# Patient Record
Sex: Male | Born: 1975 | Race: White | Hispanic: No | Marital: Married | Smoking: Current every day smoker
Health system: Southern US, Community
[De-identification: ages and names within clinical notes are randomized; demographics above are authoritative.]

## PROBLEM LIST (undated history)

## (undated) DIAGNOSIS — I639 Cerebral infarction, unspecified: Secondary | ICD-10-CM

## (undated) HISTORY — PX: TONSILLECTOMY: SUR1361

---

## 2016-03-15 ENCOUNTER — Emergency Department (HOSPITAL_COMMUNITY): Payer: No Typology Code available for payment source

## 2016-03-15 ENCOUNTER — Encounter (HOSPITAL_COMMUNITY): Payer: Self-pay | Admitting: Emergency Medicine

## 2016-03-15 DIAGNOSIS — M545 Low back pain: Secondary | ICD-10-CM | POA: Insufficient documentation

## 2016-03-15 DIAGNOSIS — F17200 Nicotine dependence, unspecified, uncomplicated: Secondary | ICD-10-CM | POA: Diagnosis not present

## 2016-03-15 DIAGNOSIS — Y939 Activity, unspecified: Secondary | ICD-10-CM | POA: Diagnosis not present

## 2016-03-15 DIAGNOSIS — S0001XA Abrasion of scalp, initial encounter: Secondary | ICD-10-CM | POA: Insufficient documentation

## 2016-03-15 DIAGNOSIS — Y999 Unspecified external cause status: Secondary | ICD-10-CM | POA: Insufficient documentation

## 2016-03-15 DIAGNOSIS — R55 Syncope and collapse: Secondary | ICD-10-CM | POA: Diagnosis not present

## 2016-03-15 DIAGNOSIS — Y92410 Unspecified street and highway as the place of occurrence of the external cause: Secondary | ICD-10-CM | POA: Diagnosis not present
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
## 2016-03-16 ENCOUNTER — Emergency Department (HOSPITAL_COMMUNITY)
Admission: EM | Admit: 2016-03-16 | Discharge: 2016-03-16 | Disposition: A | Discharge status: Home / Self Care | Payer: No Typology Code available for payment source | Attending: Emergency Medicine | Admitting: Emergency Medicine

## 2016-03-16 ENCOUNTER — Emergency Department (HOSPITAL_COMMUNITY): Payer: No Typology Code available for payment source

## 2016-03-16 DIAGNOSIS — R4020 Unspecified coma: Secondary | ICD-10-CM

## 2016-03-16 DIAGNOSIS — M7918 Myalgia, other site: Secondary | ICD-10-CM

## 2016-03-16 MED ORDER — NAPROXEN 500 MG PO TABS: 500.0000 mg | ORAL_TABLET | Freq: Two times a day (BID) | ORAL | 0 refills | Status: DC

## 2016-03-16 MED ORDER — OXYCODONE-ACETAMINOPHEN 5-325 MG PO TABS
1.0000 | ORAL_TABLET | Freq: Once | ORAL | Status: AC
Start: 2016-03-16 — End: 2016-03-16
  Administered 2016-03-16: 1 via ORAL
  Filled 2016-03-16: qty 1

## 2016-03-16 MED ORDER — CYCLOBENZAPRINE HCL 10 MG PO TABS: 10.0000 mg | ORAL_TABLET | Freq: Two times a day (BID) | ORAL | 0 refills | Status: DC | PRN
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
## 2016-03-16 NOTE — ED Notes (Signed)
Pt ambulated in hallway with no problems. 

## 2018-02-20 ENCOUNTER — Emergency Department (HOSPITAL_COMMUNITY)
Admission: EM | Admit: 2018-02-20 | Discharge: 2018-02-20 | Disposition: A | Discharge status: Home / Self Care | Payer: Self-pay | Attending: Emergency Medicine | Admitting: Emergency Medicine

## 2018-02-20 ENCOUNTER — Encounter (HOSPITAL_COMMUNITY): Payer: Self-pay

## 2018-02-20 ENCOUNTER — Emergency Department (HOSPITAL_COMMUNITY): Payer: Self-pay

## 2018-02-20 DIAGNOSIS — L0291 Cutaneous abscess, unspecified: Secondary | ICD-10-CM

## 2018-02-20 DIAGNOSIS — L02215 Cutaneous abscess of perineum: Secondary | ICD-10-CM | POA: Insufficient documentation

## 2018-02-20 DIAGNOSIS — R61 Generalized hyperhidrosis: Secondary | ICD-10-CM | POA: Insufficient documentation

## 2018-02-20 DIAGNOSIS — R509 Fever, unspecified: Secondary | ICD-10-CM | POA: Insufficient documentation

## 2018-02-20 DIAGNOSIS — F17210 Nicotine dependence, cigarettes, uncomplicated: Secondary | ICD-10-CM | POA: Insufficient documentation

## 2018-02-20 DIAGNOSIS — L03818 Cellulitis of other sites: Secondary | ICD-10-CM | POA: Insufficient documentation

## 2018-02-20 LAB — URINALYSIS, ROUTINE W REFLEX MICROSCOPIC
BACTERIA UA: NONE SEEN
BILIRUBIN URINE: NEGATIVE
Glucose, UA: NEGATIVE mg/dL
Hgb urine dipstick: NEGATIVE
Ketones, ur: 5 mg/dL — AB
LEUKOCYTES UA: NEGATIVE
Nitrite: NEGATIVE
Protein, ur: 30 mg/dL — AB
SPECIFIC GRAVITY, URINE: 1.03 (ref 1.005–1.030)
pH: 5 (ref 5.0–8.0)

## 2018-02-20 LAB — CBC WITH DIFFERENTIAL/PLATELET
Abs Immature Granulocytes: 0.1 10*3/uL (ref 0.0–0.1)
Basophils Absolute: 0.2 10*3/uL — ABNORMAL HIGH (ref 0.0–0.1)
Basophils Relative: 1 %
EOS ABS: 0.2 10*3/uL (ref 0.0–0.7)
Eosinophils Relative: 1 %
HEMATOCRIT: 50.7 % (ref 39.0–52.0)
Hemoglobin: 17.2 g/dL — ABNORMAL HIGH (ref 13.0–17.0)
Immature Granulocytes: 1 %
LYMPHS ABS: 2.1 10*3/uL (ref 0.7–4.0)
Lymphocytes Relative: 11 %
MCH: 29.8 pg (ref 26.0–34.0)
MCHC: 33.9 g/dL (ref 30.0–36.0)
MCV: 87.9 fL (ref 78.0–100.0)
MONO ABS: 2 10*3/uL — AB (ref 0.1–1.0)
MONOS PCT: 10 %
Neutro Abs: 15.5 10*3/uL — ABNORMAL HIGH (ref 1.7–7.7)
Neutrophils Relative %: 76 %
Platelets: 396 10*3/uL (ref 150–400)
RBC: 5.77 MIL/uL (ref 4.22–5.81)
RDW: 12.4 % (ref 11.5–15.5)
WBC: 20 10*3/uL — ABNORMAL HIGH (ref 4.0–10.5)

## 2018-02-20 LAB — BASIC METABOLIC PANEL
Anion gap: 9 (ref 5–15)
BUN: 12 mg/dL (ref 6–20)
CALCIUM: 9.3 mg/dL (ref 8.9–10.3)
CHLORIDE: 104 mmol/L (ref 98–111)
CO2: 25 mmol/L (ref 22–32)
CREATININE: 1.08 mg/dL (ref 0.61–1.24)
GFR calc Af Amer: >60 mL/min (ref >60)
GFR calc non Af Amer: >60 mL/min (ref >60)
GLUCOSE: 91 mg/dL (ref 70–99)
Potassium: 4 mmol/L (ref 3.5–5.1)
Sodium: 138 mmol/L (ref 135–145)

## 2018-02-20 MED ORDER — HYDROMORPHONE HCL 1 MG/ML IJ SOLN
1.0000 mg | Freq: Once | INTRAMUSCULAR | Status: AC
  Administered 2018-02-20: 1 mg via INTRAVENOUS
  Filled 2018-02-20: qty 1

## 2018-02-20 MED ORDER — CEPHALEXIN 500 MG PO CAPS: 500.0000 mg | ORAL_CAPSULE | Freq: Three times a day (TID) | ORAL | 0 refills | Status: AC

## 2018-02-20 MED ORDER — SODIUM CHLORIDE 0.9 % IV BOLUS
1000.0000 mL | Freq: Once | INTRAVENOUS | Status: AC
  Administered 2018-02-20: 1000 mL via INTRAVENOUS

## 2018-02-20 MED ORDER — KETOROLAC TROMETHAMINE 15 MG/ML IJ SOLN
15.0000 mg | Freq: Once | INTRAMUSCULAR | Status: AC
  Administered 2018-02-20: 15 mg via INTRAVENOUS
  Filled 2018-02-20: qty 1

## 2018-02-20 MED ORDER — SULFAMETHOXAZOLE-TRIMETHOPRIM 800-160 MG PO TABS: 1.0000 | ORAL_TABLET | Freq: Two times a day (BID) | ORAL | 0 refills | Status: AC

## 2018-02-20 MED ORDER — HYDROCODONE-ACETAMINOPHEN 5-325 MG PO TABS: 1.0000 | ORAL_TABLET | ORAL | 0 refills | Status: DC | PRN

## 2018-02-20 MED ORDER — LIDOCAINE-EPINEPHRINE (PF) 2 %-1:200000 IJ SOLN
30.0000 mL | Freq: Once | INTRAMUSCULAR | Status: AC
  Administered 2018-02-20: 10 mL
  Filled 2018-02-20: qty 40

## 2018-02-20 MED ORDER — IOHEXOL 300 MG/ML  SOLN
100.0000 mL | Freq: Once | INTRAMUSCULAR | Status: AC | PRN
  Administered 2018-02-20: 100 mL via INTRAVENOUS

## 2018-02-20 MED ORDER — VANCOMYCIN HCL 10 G IV SOLR
2000.0000 mg | Freq: Once | INTRAVENOUS | Status: AC
  Administered 2018-02-20: 2000 mg via INTRAVENOUS
  Filled 2018-02-20: qty 2000

## 2018-02-20 MED ORDER — FENTANYL CITRATE (PF) 100 MCG/2ML IJ SOLN
100.0000 ug | Freq: Once | INTRAMUSCULAR | Status: AC
  Administered 2018-02-20: 100 ug via INTRAVENOUS
  Filled 2018-02-20: qty 2

## 2018-02-20 NOTE — ED Notes (Signed)
Spoke with [place] in main lab about CMP and CBC as it was collected but is not showing in process. Per [place] she is receiving it now.

## 2018-02-20 NOTE — ED Triage Notes (Signed)
Pt presents with swelling to testicles x 3 days.  Pt reports h/o abscess to area, placed needle to area to attempt to drain area with no drainage reported.  Pt reports difficulty voiding and with bowel movements.  Pt reports putting boil-ease to area without relief.

## 2018-02-20 NOTE — Discharge Instructions (Addendum)
You were evaluated in the Emergency Department and after careful evaluation, we did not find any emergent condition requiring admission or further testing in the hospital.  Your symptoms today seem to be due to an abscess with surrounding cellulitis.  The abscess was drained and some packing material was placed.  This material needs to be removed and 2 days.  Please return to your primary care doctor or the emergency department in 2 days for a wound check and removal of this material.  Please take the antibiotics as directed.  Use the pain medicine as needed for pain.  Please return to the Emergency Department if you experience any worsening of your condition.  We encourage you to follow up with a primary care provider.  Thank you for allowing us to be a part of your care.

## 2018-02-20 NOTE — ED Notes (Signed)
Patient transported to CT 

## 2018-02-20 NOTE — ED Provider Notes (Signed)
[facility] Emergency Department Provider Note MRN:  [id number]  Arrival date & time: 02/20/18     Chief Complaint   Abscess   History of Present Illness   Alexander Coleman is a 42 y.o. year-old male with no pertinent past medical history presenting to the ED with chief complaint of abscess.  Patient explains he has been experiencing a "boil" in his perineum for the past 1 to 2 days.  Gradual onset, pain is progressively worsening.  Now described as a severe tenderness.  Patient tried to bring the oil to ahead today with a needle but was unsuccessful.  Patient endorsing subjective fevers with chills today.  Some excessive diaphoresis today.  Patient denies chest pain or shortness of breath, no abdominal pain.  Some dysuria today as well.  No exacerbating or relieving factors.  Review of Systems  A complete 10 system review of systems was obtained and all systems are negative except as noted in the HPI and PMH.   Patient's Health History   History reviewed. No pertinent past medical history.  Past Surgical History:  Procedure Laterality Date  . TONSILLECTOMY      History reviewed. No pertinent family history.  Social History   Socioeconomic History  . Marital status: Married    Spouse name: Not on file  . Number of children: Not on file  . Years of education: Not on file  . Highest education level: Not on file  Occupational History  . Not on file  Social Needs  . Financial resource strain: Not on file  . Food insecurity:    Worry: Not on file    Inability: Not on file  . Transportation needs:    Medical: Not on file    Non-medical: Not on file  Tobacco Use  . Smoking status: Current Every Day Smoker    Packs/day: 2.00  . Smokeless tobacco: Never Used  Substance and Sexual Activity  . Alcohol use: Yes  . Drug use: Not on file  . Sexual activity: Not on file  Lifestyle  . Physical activity:    Days per week: Not on file    Minutes per session: Not  on file  . Stress: Not on file  Relationships  . Social connections:    Talks on phone: Not on file    Gets together: Not on file    Attends religious service: Not on file    Active member of club or organization: Not on file    Attends meetings of clubs or organizations: Not on file    Relationship status: Not on file  . Intimate partner violence:    Fear of current or ex partner: Not on file    Emotionally abused: Not on file    Physically abused: Not on file    Forced sexual activity: Not on file  Other Topics Concern  . Not on file  Social History Narrative  . Not on file     Physical Exam  Vital Signs and Nursing Notes reviewed Vitals:   02/20/18 2115 02/20/18 2130  BP: 117/77 117/70  Pulse: 89 88  Resp: 18 16  Temp:    SpO2: 97% 94%    CONSTITUTIONAL: Well-appearing, mildly diaphoretic and in moderate distress due to pain NEURO:  Alert and oriented x 3, no focal deficits EYES:  eyes equal and reactive ENT/NECK:  no LAD, no JVD CARDIO: Regular rate, well-perfused, normal S1 and S2 PULM:  CTAB no wheezing or rhonchi GI/GU:  normal  bowel sounds, non-distended, non-tender; exquisite tenderness to palpation to a 3 to 4 cm fluctuant nodule to the left perineum, erythematous MSK/SPINE:  No gross deformities, no edema SKIN:  no rash, atraumatic PSYCH:  Appropriate speech and behavior  Diagnostic and Interventional Summary    EKG Interpretation  Date/Time:    Ventricular Rate:    PR Interval:    QRS Duration:   QT Interval:    QTC Calculation:   R Axis:     Text Interpretation:        Labs Reviewed  CBC WITH DIFFERENTIAL/PLATELET - Abnormal; Notable for the following components:      Result Value   WBC 20.0 (*)    Hemoglobin 17.2 (*)    Neutro Abs 15.5 (*)    Monocytes Absolute 2.0 (*)    Basophils Absolute 0.2 (*)    All other components within normal limits  URINALYSIS, ROUTINE W REFLEX MICROSCOPIC - Abnormal; Notable for the following components:    Color, Urine AMBER (*)    APPearance HAZY (*)    Ketones, ur 5 (*)    Protein, ur 30 (*)    All other components within normal limits  BASIC METABOLIC PANEL    CT ABDOMEN PELVIS W CONTRAST  Final Result      Medications  vancomycin (VANCOCIN) 2,000 mg in sodium chloride 0.9 % 500 mL IVPB (2,000 mg Intravenous New Bag/Given 02/20/18 2111)  ketorolac (TORADOL) 15 MG/ML injection 15 mg (15 mg Intravenous Given 02/20/18 1827)  HYDROmorphone (DILAUDID) injection 1 mg (1 mg Intravenous Given 02/20/18 1827)  sodium chloride 0.9 % bolus 1,000 mL (0 mLs Intravenous Stopped 02/20/18 2042)  iohexol (OMNIPAQUE) 300 MG/ML solution 100 mL (100 mLs Intravenous Contrast Given 02/20/18 1916)  lidocaine-EPINEPHrine (XYLOCAINE W/EPI) 2 %-1:200000 (PF) injection 30 mL (10 mLs Other Given 02/20/18 2043)  fentaNYL (SUBLIMAZE) injection 100 mcg (100 mcg Intravenous Given 02/20/18 2047)  fentaNYL (SUBLIMAZE) injection 100 mcg (100 mcg Intravenous Given 02/20/18 2042)     .Marland Kitchen.Incision and Drainage Date/Time: 02/20/2018 9:11 PM Performed by: Sabas SousBero, Maddalyn Lutze M, MD Authorized by: Sabas SousBero, Hailley Byers M, MD   Consent:    Consent obtained:  Verbal   Consent given by:  Patient   Risks discussed:  Incomplete drainage Location:    Type:  Abscess   Location:  Anogenital   Anogenital location:  Perineum Pre-procedure details:    Skin preparation:  Betadine Sedation:    Sedation type:  Anxiolysis (fentanyl 200mcg total) Anesthesia (see MAR for exact dosages):    Anesthesia method:  Local infiltration   Local anesthetic:  Lidocaine 1% WITH epi Procedure type:    Complexity:  Complex Procedure details:    Needle aspiration: no     Incision types:  Single straight   Incision depth:  Subcutaneous   Scalpel blade:  11   Wound management:  Probed and deloculated and irrigated with saline   Drainage:  Purulent   Drainage amount:  Copious   Wound treatment:  Wound left open   Packing materials:  1/2 in iodoform gauze Post-procedure  details:    Patient tolerance of procedure:  Tolerated well, no immediate complications    Critical Care  ED Course and Medical Decision Making  I have reviewed the triage vital signs and the nursing notes.  Pertinent labs & imaging results that were available during my care of the patient were reviewed by me and considered in my medical decision making (see below for details). Clinical Course as of Feb 21 2203  Tue Feb 20, 2018  [age] year old male presenting with abscess of the perineum, likely surrounding cellulitis, subjective fevers.  Vital signs stable, but diaphoretic appearing.  Will CT to exclude the possibility of deep space abscess.  White count 20 will empirically cover with vancomycin.   [MB]    Clinical Course User Index [MB] Sabas Sous, MD    CT with no evidence of deeper space abscess.  Moderate sized abscess of the perineum incised and drained as described above.  Given IV vancomycin, given prescription for Keflex and Bactrim given the concern for surrounding cellulitis.  Wound was packed, patient instructed to return to the ED or see his primary care provider for wound check and removal of packing material in 2 days.  After the discussed management above, the patient was determined to be safe for discharge.  The patient was in agreement with this plan and all questions regarding their care were answered.  ED return precautions were discussed and the patient will return to the ED with any significant worsening of condition.  Elmer Sow. Pilar Plate, MD [facility] Emergency Medicine [facility] [email or web address]  Final Clinical Impressions(s) / ED Diagnoses     ICD-10-CM   1. Abscess L02.91   2. Cellulitis of other specified site L03.818     ED Discharge Orders        Ordered    HYDROcodone-acetaminophen (NORCO/VICODIN) 5-325 MG tablet  Every 4 hours PRN     02/20/18 2159    sulfamethoxazole-trimethoprim (BACTRIM DS,SEPTRA DS) 800-160 MG tablet   2 times daily     02/20/18 2159    cephALEXin (KEFLEX) 500 MG capsule  3 times daily     02/20/18 2159         Sabas Sous, MD 02/20/18 2204

## 2018-02-20 NOTE — ED Notes (Signed)
Patient verbalizes understanding of discharge instructions. Opportunity for questioning and answers were provided. Armband removed by staff, pt discharged from ED.  

## 2018-02-20 NOTE — ED Provider Notes (Signed)
Patient placed in Quick Look pathway, seen and evaluated   Chief Complaint: Perineal abscess  HPI:   Patient presents with 3-day history of progressively worsening swelling and tenderness to the perineum.  Denies testicular pain but notes mild scrotal swelling this morning.  He does note nausea and intermittent abdominal pain but denies any this time.  Denies vomiting, fevers, or chills.  Attempted to lance the area with a needle with no drainage.  States that he has had some difficulty voiding but denies urinary frequency, urgency, or dysuria.  Denies hematuria. Has not had a bowel movement in 3 days.  ROS: Positive for abscess, scrotal swelling, nausea, difficulty urinating Negative for vomiting, melena, hematuria, dysuria  Physical Exam:   Gen: No distress  Neuro: Awake and Alert  Skin: Warm    Focused Exam: Examination performed in the presence of a chaperone.  Abdomen soft nontender.  No scrotal swelling or testicular tenderness to palpation.  There is a 2 x 3 cm area of fluctuance and tenderness along the perineum. No drainage.    Initiation of care has begun. The patient has been counseled on the process, plan, and necessity for staying for the completion/evaluation, and the remainder of the medical screening examination    Jeanie SewerFawze, Kerby Borner A, PA-C 02/20/18 1543    Little, Ambrose Finlandachel Morgan, MD 02/21/18 [phone]

## 2018-02-22 ENCOUNTER — Other Ambulatory Visit: Payer: Self-pay

## 2018-02-22 ENCOUNTER — Emergency Department (HOSPITAL_COMMUNITY)
Admission: EM | Admit: 2018-02-22 | Discharge: 2018-02-22 | Disposition: A | Discharge status: Home / Self Care | Payer: Self-pay | Attending: Emergency Medicine | Admitting: Emergency Medicine

## 2018-02-22 ENCOUNTER — Encounter (HOSPITAL_COMMUNITY): Payer: Self-pay | Admitting: Emergency Medicine

## 2018-02-22 DIAGNOSIS — Z4801 Encounter for change or removal of surgical wound dressing: Secondary | ICD-10-CM | POA: Insufficient documentation

## 2018-02-22 DIAGNOSIS — F17210 Nicotine dependence, cigarettes, uncomplicated: Secondary | ICD-10-CM | POA: Insufficient documentation

## 2018-02-22 DIAGNOSIS — Z5189 Encounter for other specified aftercare: Secondary | ICD-10-CM

## 2018-02-22 DIAGNOSIS — Z79899 Other long term (current) drug therapy: Secondary | ICD-10-CM | POA: Insufficient documentation

## 2018-02-22 NOTE — ED Provider Notes (Signed)
[facility] EMERGENCY DEPARTMENT Provider Note  CSN: [id number] Arrival date & time: 02/22/18  1149   History   Chief Complaint Chief Complaint  Patient presents with  . packing removal    HPI Alexander Coleman is a 42 y.o. male with no significant medical history who presented to the ED for wound check. Patient had I&D of perineum done on 02/20/18 that was packed. Denies fever, chills, warmth, unusual odor or discharge. He has had his wife help with dressing changes at home. Denies changes in urinary habits.  History reviewed. No pertinent past medical history.  There are no active problems to display for this patient.   Past Surgical History:  Procedure Laterality Date  . TONSILLECTOMY      Home Medications    Prior to Admission medications   Medication Sig Start Date End Date Taking? Authorizing Provider  cephALEXin (KEFLEX) 500 MG capsule Take 1 capsule (500 mg total) by mouth 3 (three) times daily for 7 days. 02/20/18 02/27/18  Sabas Sous, MD  cyclobenzaprine (FLEXERIL) 10 MG tablet Take 1 tablet (10 mg total) by mouth 2 (two) times daily as needed for muscle spasms. Patient not taking: Reported on 02/20/2018 03/16/16   Horton, Mayer Masker, MD  HYDROcodone-acetaminophen (NORCO/VICODIN) 5-325 MG tablet Take 1 tablet by mouth every 4 (four) hours as needed. 02/20/18   Sabas Sous, MD  ibuprofen (ADVIL,MOTRIN) 800 MG tablet Take 800 mg by mouth every 8 (eight) hours as needed for mild pain.    [provider]  loratadine (CLARITIN) 10 MG tablet Take 10 mg by mouth daily.    [provider]  naproxen (NAPROSYN) 500 MG tablet Take 1 tablet (500 mg total) by mouth 2 (two) times daily. Patient not taking: Reported on 02/20/2018 03/16/16   Horton, Mayer Masker, MD  sulfamethoxazole-trimethoprim (BACTRIM DS,SEPTRA DS) 800-160 MG tablet Take 1 tablet by mouth 2 (two) times daily for 7 days. 02/20/18 02/27/18  Sabas Sous, MD    Family History History  reviewed. No pertinent family history.  Social History Social History   Tobacco Use  . Smoking status: Current Every Day Smoker    Packs/day: 2.00  . Smokeless tobacco: Never Used  Substance Use Topics  . Alcohol use: Yes  . Drug use: Not on file     Allergies   Patient has no known allergies.   Review of Systems Review of Systems  Constitutional: Negative for chills and fever.  Gastrointestinal: Negative.   Genitourinary: Negative.   Skin: Negative.   Hematological: Negative.      Physical Exam Updated Vital Signs BP 135/81 (BP Location: Right Arm)   Pulse 92   Temp 98.9 F (37.2 C) (Oral)   Resp 18   SpO2 96%   Physical Exam  Constitutional: Vital signs are normal. He appears well-developed and well-nourished. He is cooperative.  Genitourinary: Testes normal and penis normal.    Uncircumcised.  Neurological: He is alert.  Skin: Skin is warm. No erythema.  Vitals reviewed.    ED Treatments / Results  Labs (all labs ordered are listed, but only abnormal results are displayed) Labs Reviewed - No data to display  EKG None  Radiology Ct Abdomen Pelvis W Contrast  Result Date: 02/20/2018 CLINICAL DATA:  Perineal abscess.  Testicular swelling for 3 days EXAM: CT ABDOMEN AND PELVIS WITH CONTRAST TECHNIQUE: Multidetector CT imaging of the abdomen and pelvis was performed using the standard protocol following bolus administration of intravenous contrast. CONTRAST:  100mL OMNIPAQUE IOHEXOL 300 MG/ML  SOLN COMPARISON:  None. FINDINGS: Lower chest:  Negative. Hepatobiliary: Tiny low-density in the right liver which is more discrete appearing on coronal reformats, too small for characterization.No evidence of biliary obstruction or stone. Pancreas: Unremarkable. Spleen: 1 cm low-density in the inferior subcapsular spleen, nonspecific but considered incidental in isolation. Adrenals/Urinary Tract: Negative adrenals. Complex low-density lesion in the upper pole right  kidney which is greater than fluid density secondary to debris, enhancement, or septation. Maximal size is 2 cm. No hydronephrosis or stone. Unremarkable bladder. Stomach/Bowel:  No obstruction. No appendicitis. Vascular/Lymphatic: No acute vascular abnormality. Enlarged appearance of bilateral external iliac chain lymph nodes considered reactive in this setting Reproductive:Left paramedian perineum is in the elongated abscess measuring 6.6 x 1.7 x 3.4 cm. No regional soft tissue emphysema. No visible communication with the anus. No visible extension along the partially covered scrotum. Other: No ascites or pneumoperitoneum. Musculoskeletal: No acute abnormalities. IMPRESSION: 1. 6.6 x 1.7 x 3.4 cm perineal abscess. No visible anorectal communication or supralevator inflammation. 2. Complex/partial cystic lesion in the right kidney measuring 2 cm. Recommend follow-up, preferably with renal MRI. Electronically Signed   By: Marnee SpringJonathon  Watts M.D.   On: 02/20/2018 19:42    Procedures Procedures (including critical care time)  Medications Ordered in ED Medications - No data to display   Initial Impression / Assessment and Plan / ED Course  Triage vital signs and the nursing notes have been reviewed.  Pertinent labs & imaging results that were available during care of the patient were reviewed and considered in medical decision making (see chart for details).   Patient presents for wound check 2 days following an I&D. Patient has had no issues with the wound since then. On physical exam, there are no signs of cellulitis including fever, warmth or surrounding erythema. Minimal purulent discharge seen in wound. Overall wound is healing well. Packing removed today.  Final Clinical Impressions(s) / ED Diagnoses  1. Wound Check. Packing removed from where perineal abscess was drained. Education provided on appropriate wound care and s/s of infection that would warrant follow-up. Advised to continue taking  Keflex and Bactrim prescribed on 02/20/18.  Dispo: Home. After thorough clinical evaluation, this patient is determined to be medically stable and can be safely discharged with the previously mentioned treatment and/or outpatient follow-up/referral(s). At this time, there are no other apparent medical conditions that require further screening, evaluation or treatment.   Final diagnoses:  Wound check, abscess    ED Discharge Orders    None        Reva BoresMortis, Gershon Shorten I, PA-C 02/22/18 1230    Raeford RazorKohut, Stephen, MD 02/22/18 [phone]

## 2018-02-22 NOTE — ED Notes (Signed)
Patient able to ambulate independently  

## 2018-02-22 NOTE — Discharge Instructions (Addendum)
The wound is healing well. The packing was removed today and you do not need anymore today. Continue taking the antibiotics that you were prescribed on 02/20/18.  You may clean the wound with soap and water. Apply warm compresses or soak in warm water for several times a day.  Follow-up with a medical provider if you have one or more of the following symptoms: fever; increased redness, warmth or tenderness at the wound site; pain beyond where the initial wound was; unusual or smelly discharge.  I have listed information below for a primary care clinic where you can follow-up at for this issue or other day-to-day medical issues.

## 2018-02-22 NOTE — ED Triage Notes (Signed)
Pt presents to ED for removal of packing to an area of his groin that was drained on Tuesday

## 2018-04-26 ENCOUNTER — Other Ambulatory Visit: Payer: Self-pay

## 2018-04-26 ENCOUNTER — Encounter (HOSPITAL_COMMUNITY): Payer: Self-pay

## 2018-04-26 ENCOUNTER — Emergency Department (HOSPITAL_COMMUNITY)
Admission: EM | Admit: 2018-04-26 | Discharge: 2018-04-26 | Disposition: A | Discharge status: Home / Self Care | Payer: Worker's Compensation | Attending: Emergency Medicine | Admitting: Emergency Medicine

## 2018-04-26 DIAGNOSIS — Y9389 Activity, other specified: Secondary | ICD-10-CM | POA: Insufficient documentation

## 2018-04-26 DIAGNOSIS — Z23 Encounter for immunization: Secondary | ICD-10-CM | POA: Diagnosis not present

## 2018-04-26 DIAGNOSIS — Y990 Civilian activity done for income or pay: Secondary | ICD-10-CM | POA: Insufficient documentation

## 2018-04-26 DIAGNOSIS — Y929 Unspecified place or not applicable: Secondary | ICD-10-CM | POA: Insufficient documentation

## 2018-04-26 DIAGNOSIS — F17200 Nicotine dependence, unspecified, uncomplicated: Secondary | ICD-10-CM | POA: Diagnosis not present

## 2018-04-26 DIAGNOSIS — W268XXA Contact with other sharp object(s), not elsewhere classified, initial encounter: Secondary | ICD-10-CM | POA: Insufficient documentation

## 2018-04-26 DIAGNOSIS — Z79899 Other long term (current) drug therapy: Secondary | ICD-10-CM | POA: Insufficient documentation

## 2018-04-26 DIAGNOSIS — S61210A Laceration without foreign body of right index finger without damage to nail, initial encounter: Secondary | ICD-10-CM | POA: Diagnosis not present

## 2018-04-26 MED ORDER — TETANUS-DIPHTH-ACELL PERTUSSIS 5-2.5-18.5 LF-MCG/0.5 IM SUSP
0.5000 mL | Freq: Once | INTRAMUSCULAR | Status: AC
  Administered 2018-04-26: 0.5 mL via INTRAMUSCULAR
  Filled 2018-04-26: qty 0.5

## 2018-04-26 MED ORDER — LIDOCAINE HCL (PF) 1 % IJ SOLN
5.0000 mL | Freq: Once | INTRAMUSCULAR | Status: AC
  Administered 2018-04-26: 5 mL
  Filled 2018-04-26: qty 5

## 2018-04-26 MED ORDER — ACETAMINOPHEN 325 MG PO TABS
650.0000 mg | ORAL_TABLET | Freq: Once | ORAL | Status: AC
  Administered 2018-04-26: 650 mg via ORAL
  Filled 2018-04-26: qty 2

## 2018-04-26 NOTE — ED Triage Notes (Signed)
Pt was cleaning a metal hood at work and cut open his right index finger. Bleeding is controlled at this time. Pt reports he pour peroxide on it because he was using degreaser when it happened.

## 2018-04-26 NOTE — ED Notes (Signed)
Pt verbalized understanding of discharge instructions and denies any further questions at this time.   

## 2018-04-26 NOTE — ED Provider Notes (Signed)
[facility] EMERGENCY DEPARTMENT Provider Note   CSN: [id number] Arrival date & time: 04/26/18  1251  History   Chief Complaint Chief Complaint  Patient presents with  . Laceration    HPI Alexander Coleman is a 42 y.o. male with a history of tobacco abuse who presents to the emergency department status post right second digit laceration which occurred 1 hour prior to arrival.  Patient states he was cleaning a metal hood at work when he accidentally cut the finger.  He applied peroxide on scene to clean the area and presented to the emergency department.  He states he was able to control bleeding with pressure.  No other specific alleviating or aggravating factors.  He states the area is mildly uncomfortable, 4 out of 10 in severity, no specific alleviating or aggravating factors.  Denies numbness, tingling, weakness, or any other areas of injury.  Patient is unsure of when his last tetanus vaccination was.  HPI  History reviewed. No pertinent past medical history.  There are no active problems to display for this patient.   Past Surgical History:  Procedure Laterality Date  . TONSILLECTOMY          Home Medications    Prior to Admission medications   Medication Sig Start Date End Date Taking? Authorizing Provider  cyclobenzaprine (FLEXERIL) 10 MG tablet Take 1 tablet (10 mg total) by mouth 2 (two) times daily as needed for muscle spasms. Patient not taking: Reported on 02/20/2018 03/16/16   Horton, Mayer Masker, MD  HYDROcodone-acetaminophen (NORCO/VICODIN) 5-325 MG tablet Take 1 tablet by mouth every 4 (four) hours as needed. 02/20/18   Sabas Sous, MD  ibuprofen (ADVIL,MOTRIN) 800 MG tablet Take 800 mg by mouth every 8 (eight) hours as needed for mild pain.    [provider]  loratadine (CLARITIN) 10 MG tablet Take 10 mg by mouth daily.    [provider]  naproxen (NAPROSYN) 500 MG tablet Take 1 tablet (500 mg total) by mouth 2 (two) times  daily. Patient not taking: Reported on 02/20/2018 03/16/16   Horton, Mayer Masker, MD    Family History No family history on file.  Social History Social History   Tobacco Use  . Smoking status: Current Every Day Smoker    Packs/day: 3.00  . Smokeless tobacco: Never Used  Substance Use Topics  . Alcohol use: Not Currently  . Drug use: Never     Allergies   Patient has no known allergies.   Review of Systems Review of Systems  Skin: Positive for wound.  Neurological: Negative for weakness and numbness.    Physical Exam Updated Vital Signs BP (!) 139/97 (BP Location: Left Arm)   Pulse 79   Temp (!) 97.3 F (36.3 C) (Oral)   Resp 16   Ht 6' (1.829 m)   Wt 97.5 kg   SpO2 97%   BMI 29.16 kg/m   Physical Exam  Constitutional: He appears well-developed and well-nourished. No distress.  HENT:  Head: Normocephalic and atraumatic.  Eyes: Conjunctivae are normal. Right eye exhibits no discharge. Left eye exhibits no discharge.  Cardiovascular:  2+ symmetric radial pulses.   Musculoskeletal:  Upper extremities: Full AROM to bilateral wrists and all MCP/PIP joints. Tender minimally over laceration, otherwise nontender.   Neurological: He is alert.  Clear speech. Sensation grossly intact to bilateral upper extremities. 5/5 symmetric grip strength. Able to perform OK sign, thumbs up, and cross 2nd/3rd digits.   Skin: Skin is warm and  dry. Capillary refill takes less than 2 seconds.  RUE: 1cm laceration to the palmar surface of the right 2nd distal phalanx. No nailbed involvement. No active bleeding.   Psychiatric: He has a normal mood and affect. His behavior is normal. Thought content normal.  Nursing note and vitals reviewed.    ED Treatments / Results  Labs (all labs ordered are listed, but only abnormal results are displayed) Labs Reviewed - No data to display  EKG None  Radiology No results found.  Procedures .Marland KitchenLaceration Repair Date/Time: 04/26/2018 2:30  PM Performed by: Cherly Anderson, PA-C Authorized by: Cherly Anderson, PA-C   Consent:    Consent obtained:  Verbal   Consent given by:  Patient   Risks discussed:  Infection, need for additional repair, nerve damage, pain, poor cosmetic result, poor wound healing, vascular damage, tendon damage and retained foreign body   Alternatives discussed:  No treatment Anesthesia (see MAR for exact dosages):    Anesthesia method:  Nerve block   Block needle gauge:  27 G   Block anesthetic:  Lidocaine 1% w/o epi   Block injection procedure:  Anatomic landmarks identified, anatomic landmarks palpated, introduced needle, negative aspiration for blood and incremental injection   Block outcome:  Anesthesia achieved Laceration details:    Location:  Finger   Finger location:  R index finger   Length (cm):  1   Depth (mm):  3 Repair type:    Repair type:  Simple Pre-procedure details:    Preparation:  Patient was prepped and draped in usual sterile fashion Exploration:    Hemostasis achieved with:  Direct pressure   Wound exploration: wound explored through full range of motion and entire depth of wound probed and visualized   Treatment:    Area cleansed with:  Betadine   Amount of cleaning:  Standard   Irrigation solution:  Sterile water   Irrigation volume:  1L   Irrigation method:  Pressure wash Skin repair:    Repair method:  Sutures   Suture size:  4-0   Suture material:  Nylon   Suture technique:  Simple interrupted   Number of sutures:  2 Approximation:    Approximation:  Close Post-procedure details:    Dressing:  Antibiotic ointment and non-adherent dressing   Patient tolerance of procedure:  Tolerated well, no immediate complications   (including critical care time)  Medications Ordered in ED Medications  lidocaine (PF) (XYLOCAINE) 1 % injection 5 mL (5 mLs Infiltration Given 04/26/18 1416)  Tdap (BOOSTRIX) injection 0.5 mL (0.5 mLs Intramuscular Given  04/26/18 1416)  acetaminophen (TYLENOL) tablet 650 mg (650 mg Oral Given 04/26/18 1415)     Initial Impression / Assessment and Plan / ED Course  I have reviewed the triage vital signs and the nursing notes.  Pertinent labs & imaging results that were available during my care of the patient were reviewed by me and considered in my medical decision making (see chart for details).    Patient presents to the emergency department with laceration to R 2nd finger which occurred within 8 hours PTA. Patient nontoxic appearing, resting comfortably. Given description of injury and fairly superficial laceration, do not suspect underlying fracture/dislocation. Pressure irrigation performed. Wound explored and base of wound visualized in a bloodless field without evidence of foreign body. Laceration repair per procedure note above, tolerated well. Tetanus updated at today's visit. Do not feel that abx are indicated at this time based on wound appearance and lack of significant comorbidities.  Discussed suture home care as well as need for wound recheck and suture removal in 7 days.  I discussed results, treatment plan, need for follow-up, and return precautions with the patient including signs of infection. Provided opportunity for questions, patient confirmed understanding and is in agreement with plan.     Final Clinical Impressions(s) / ED Diagnoses   Final diagnoses:  Laceration of right index finger without foreign body without damage to nail, initial encounter    ED Discharge Orders    None       Cherly Anderson, PA-C 04/26/18 1456    Arby Barrette, MD 04/26/18 1503

## 2018-04-26 NOTE — Discharge Instructions (Addendum)
You were seen in the emergency department today for a laceration. Your laceration was closed with 2 stitches. Please keep this area clean and dry for the next 24 hours, after 24 hours you may get this area wet, but avoid soaking the area. Keep the area covered as best possible especially when in the sun to help in minimizing scarring.   Your tetanus has been updated   You will need to have the stitches removed and the wound rechecked in 7 days. Please return to the emergency department, go to an urgent care, or see your primary care provider to have this performed. Return to the ER soon should you start to experience pus type drainage from the wound, redness around the wound, or fevers as this could indicate the area is infected, please return to the ER for any other worsening symptoms or concerns that you may have.   

## 2018-04-26 NOTE — ED Notes (Signed)
ED Provider at bedside. 

## 2018-12-19 ENCOUNTER — Encounter (HOSPITAL_COMMUNITY): Payer: Self-pay

## 2018-12-19 ENCOUNTER — Ambulatory Visit (HOSPITAL_COMMUNITY)
Admission: EM | Admit: 2018-12-19 | Discharge: 2018-12-19 | Disposition: A | Discharge status: Home / Self Care | Payer: PRIVATE HEALTH INSURANCE | Attending: Family Medicine | Admitting: Family Medicine

## 2018-12-19 ENCOUNTER — Other Ambulatory Visit: Payer: Self-pay

## 2018-12-19 DIAGNOSIS — H6123 Impacted cerumen, bilateral: Secondary | ICD-10-CM | POA: Diagnosis not present

## 2018-12-19 DIAGNOSIS — H66001 Acute suppurative otitis media without spontaneous rupture of ear drum, right ear: Secondary | ICD-10-CM

## 2018-12-19 MED ORDER — NEOMYCIN-POLYMYXIN-HC 3.5-10000-1 OT SUSP: 3.0000 [drp] | Freq: Three times a day (TID) | OTIC | 0 refills | Status: DC

## 2018-12-19 MED ORDER — CEFDINIR 300 MG PO CAPS: 300.0000 mg | ORAL_CAPSULE | Freq: Two times a day (BID) | ORAL | 0 refills | Status: AC

## 2018-12-19 NOTE — ED Provider Notes (Signed)
[facility]    CSN: [id number] Arrival date & time: 12/19/18  1041     History   Chief Complaint Chief Complaint  Patient presents with  . Ear Fullness    Bilateral    HPI Alexander Coleman is a 43 y.o. male no contributing past medical history presenting today for evaluation of bilateral ear fullness.  He states that his symptoms have been progressive over the past couple weeks.  He denies any associated pain.  States that he has history of earwax buildup.  He has tried over-the-counter eardrops without relief.  Has had some mild congestion, but this is normal for him related to allergies.  Denies cough or fevers.  HPI  History reviewed. No pertinent past medical history.  There are no active problems to display for this patient.   Past Surgical History:  Procedure Laterality Date  . TONSILLECTOMY         Home Medications    Prior to Admission medications   Medication Sig Start Date End Date Taking? Authorizing Provider  cefdinir (OMNICEF) 300 MG capsule Take 1 capsule (300 mg total) by mouth 2 (two) times daily for 7 days. 12/19/18 12/26/18  ,  C, PA-C  HYDROcodone-acetaminophen (NORCO/VICODIN) 5-325 MG tablet Take 1 tablet by mouth every 4 (four) hours as needed. 02/20/18   Sabas Sous, MD  ibuprofen (ADVIL,MOTRIN) 800 MG tablet Take 800 mg by mouth every 8 (eight) hours as needed for mild pain.    [provider]  loratadine (CLARITIN) 10 MG tablet Take 10 mg by mouth daily.    [provider]    Family History Family History  Family history unknown: Yes    Social History Social History   Tobacco Use  . Smoking status: Current Every Day Smoker    Packs/day: 3.00  . Smokeless tobacco: Never Used  Substance Use Topics  . Alcohol use: Not Currently  . Drug use: Never     Allergies   Patient has no known allergies.   Review of Systems Review of Systems  Constitutional: Negative for activity change, appetite  change, chills, fatigue and fever.  HENT: Positive for hearing loss. Negative for congestion, ear pain, rhinorrhea, sinus pressure, sore throat and trouble swallowing.   Eyes: Negative for discharge and redness.  Respiratory: Negative for cough, chest tightness and shortness of breath.   Cardiovascular: Negative for chest pain.  Gastrointestinal: Negative for abdominal pain, diarrhea, nausea and vomiting.  Musculoskeletal: Negative for myalgias.  Skin: Negative for rash.  Neurological: Negative for dizziness, light-headedness and headaches.     Physical Exam Triage Vital Signs ED Triage Vitals  Enc Vitals Group     BP 12/19/18 1128 122/84     Pulse Rate 12/19/18 1128 69     Resp 12/19/18 1128 18     Temp 12/19/18 1130 98.5 F (36.9 C)     Temp Source 12/19/18 1130 Oral     SpO2 12/19/18 1128 99 %     Weight --      Height --      Head Circumference --      Peak Flow --      Pain Score 12/19/18 1129 0     Pain Loc --      Pain Edu? --      Excl. in GC? --    No data found.  Updated Vital Signs BP 122/84 (BP Location: Left Arm)   Pulse 69   Temp 98.5 F (36.9 C) (Oral)  Resp 18   SpO2 99%   Visual Acuity Right Eye Distance:   Left Eye Distance:   Bilateral Distance:    Right Eye Near:   Left Eye Near:    Bilateral Near:     Physical Exam Vitals signs and nursing note reviewed.  Constitutional:      Appearance: He is well-developed.  HENT:     Head: Normocephalic and atraumatic.     Ears:     Comments: Bilateral cerumen impaction  After removal bilateral canals appear erythematous, but nonswollen, right TM erythematous and dull Eyes:     Conjunctiva/sclera: Conjunctivae normal.  Neck:     Musculoskeletal: Neck supple.  Cardiovascular:     Rate and Rhythm: Normal rate and regular rhythm.     Heart sounds: No murmur.  Pulmonary:     Effort: Pulmonary effort is normal. No respiratory distress.     Breath sounds: Normal breath sounds.     Comments:  Breathing comfortably at rest, CTABL, no wheezing, rales or other adventitious sounds auscultated Abdominal:     Palpations: Abdomen is soft.     Tenderness: There is no abdominal tenderness.  Skin:    General: Skin is warm and dry.  Neurological:     Mental Status: He is alert.      UC Treatments / Results  Labs (all labs ordered are listed, but only abnormal results are displayed) Labs Reviewed - No data to display  EKG None  Radiology No results found.  Procedures Ear Cerumen Removal Date/Time: 12/19/2018 1:01 PM Performed by: , Junius CreamerHallie C, PA-C Authorized by: Mardella LaymanHagler, Brian, MD   Consent:    Consent obtained:  Verbal   Consent given by:  Patient   Risks discussed:  Bleeding, TM perforation and pain   Alternatives discussed:  No treatment Procedure details:    Location:  L ear and R ear   Procedure type: curette   Post-procedure details:    Inspection:  TM intact   Hearing quality:  Improved   Patient tolerance of procedure:  Tolerated well, no immediate complications   (including critical care time)  Medications Ordered in UC Medications - No data to display  Initial Impression / Assessment and Plan / UC Course  I have reviewed the triage vital signs and the nursing notes.  Pertinent labs & imaging results that were available during my care of the patient were reviewed by me and considered in my medical decision making (see chart for details).     Cerumen impaction removed through combination efforts of irrigation from nursing staff, and curette manipulation by myself.  Hearing improved after removal.  Right TM concerning for otitis media will treat with Omnicef which should also prevent development of infection in canals.  Discussed using Debrox in future to prevent further buildup.  Continue to monitor,Discussed strict return precautions. Patient verbalized understanding and is agreeable with plan.  Final Clinical Impressions(s) / UC Diagnoses   Final  diagnoses:  Bilateral hearing loss due to cerumen impaction  Non-recurrent acute suppurative otitis media of right ear without spontaneous rupture of tympanic membrane     Discharge Instructions     Please try using debrox ear drops 5-10 drops weekly to prevent further build up  Omnicef twice daily x 1 week to treat right ear infection   ED Prescriptions    Medication Sig Dispense Auth. Provider   neomycin-polymyxin-hydrocortisone (CORTISPORIN) 3.5-10000-1 OTIC suspension  (Status: Discontinued) Place 3 drops into both ears 3 (three) times daily for  5 days. 10 mL ,  C, PA-C   cefdinir (OMNICEF) 300 MG capsule Take 1 capsule (300 mg total) by mouth 2 (two) times daily for 7 days. 14 capsule ,  C, PA-C     Controlled Substance Prescriptions [place] Controlled Substance Registry consulted? Not Applicable   Lew Dawes, [place] 12/19/18 1302

## 2018-12-19 NOTE — Discharge Instructions (Signed)
Please try using debrox ear drops 5-10 drops weekly to prevent further build up  Omnicef twice daily x 1 week to treat right ear infection

## 2018-12-19 NOTE — ED Triage Notes (Signed)
Pt presents with bilateral ear fullness with no complaints of pain

## 2021-07-01 ENCOUNTER — Encounter (HOSPITAL_COMMUNITY): Payer: Self-pay

## 2021-07-01 ENCOUNTER — Emergency Department (HOSPITAL_COMMUNITY): Payer: No Typology Code available for payment source

## 2021-07-01 ENCOUNTER — Emergency Department (HOSPITAL_COMMUNITY)
Admission: EM | Admit: 2021-07-01 | Discharge: 2021-07-01 | Disposition: A | Discharge status: Home / Self Care | Payer: No Typology Code available for payment source | Attending: Emergency Medicine | Admitting: Emergency Medicine

## 2021-07-01 DIAGNOSIS — W260XXA Contact with knife, initial encounter: Secondary | ICD-10-CM | POA: Insufficient documentation

## 2021-07-01 DIAGNOSIS — R000 Tachycardia, unspecified: Secondary | ICD-10-CM | POA: Insufficient documentation

## 2021-07-01 DIAGNOSIS — F17210 Nicotine dependence, cigarettes, uncomplicated: Secondary | ICD-10-CM | POA: Insufficient documentation

## 2021-07-01 DIAGNOSIS — S6992XA Unspecified injury of left wrist, hand and finger(s), initial encounter: Secondary | ICD-10-CM | POA: Diagnosis present

## 2021-07-01 DIAGNOSIS — S61213A Laceration without foreign body of left middle finger without damage to nail, initial encounter: Secondary | ICD-10-CM | POA: Diagnosis not present

## 2021-07-01 DIAGNOSIS — Y9289 Other specified places as the place of occurrence of the external cause: Secondary | ICD-10-CM | POA: Diagnosis not present

## 2021-07-01 DIAGNOSIS — S62663A Nondisplaced fracture of distal phalanx of left middle finger, initial encounter for closed fracture: Secondary | ICD-10-CM | POA: Insufficient documentation

## 2021-07-01 DIAGNOSIS — Y990 Civilian activity done for income or pay: Secondary | ICD-10-CM | POA: Insufficient documentation

## 2021-07-01 MED ORDER — IBUPROFEN 400 MG PO TABS
600.0000 mg | ORAL_TABLET | Freq: Once | ORAL | Status: AC
  Administered 2021-07-01: 600 mg via ORAL
  Filled 2021-07-01: qty 1

## 2021-07-01 MED ORDER — TETANUS-DIPHTH-ACELL PERTUSSIS 5-2.5-18.5 LF-MCG/0.5 IM SUSY: 0.5000 mL | PREFILLED_SYRINGE | Freq: Once | INTRAMUSCULAR | Status: DC

## 2021-07-01 MED ORDER — ACETAMINOPHEN 325 MG PO TABS
650.0000 mg | ORAL_TABLET | Freq: Once | ORAL | Status: AC
  Administered 2021-07-01: 650 mg via ORAL
  Filled 2021-07-01: qty 2

## 2021-07-01 MED ORDER — CEPHALEXIN 250 MG PO CAPS
500.0000 mg | ORAL_CAPSULE | Freq: Once | ORAL | Status: AC
  Administered 2021-07-01: 500 mg via ORAL
  Filled 2021-07-01: qty 2

## 2021-07-01 MED ORDER — CEPHALEXIN 500 MG PO CAPS: 500.0000 mg | ORAL_CAPSULE | Freq: Two times a day (BID) | ORAL | 0 refills | Status: AC

## 2021-07-01 NOTE — ED Provider Notes (Signed)
Emergency Medicine Provider Triage Evaluation Note  Alexander Coleman , a 45 y.o. male  was evaluated in triage.  Pt complains of finger injury.  He states that he cut his left middle finger shortly prior to arrival on a meat cutter.  Chart review shows last tetanus was 2019.  He denies any blood thinner use.  No other injuries.  Review of Systems  Positive: Left middle finger injury.  Negative: Numbness  Physical Exam  BP (!) 137/96 (BP Location: Right Arm)    Pulse 90    Temp 98.5 F (36.9 C) (Oral)    Resp 18    Ht 6' (1.829 m)    Wt 98 kg    SpO2 94%    BMI 29.30 kg/m  Gen:   Awake, no distress   Resp:  Normal effort  MSK:   Moves extremities without difficulty  Other:  Laceration on left middle finger.  Wound dressing placed due to bleeding.   Medical Decision Making  Medically screening exam initiated at 8:10 PM.  Appropriate orders placed.  Abdon Petrosky was informed that the remainder of the evaluation will be completed by another provider, this initial triage assessment does not replace that evaluation, and the importance of remaining in the ED until their evaluation is complete.  Note: Portions of this report may have been transcribed using voice recognition software. Every effort was made to ensure accuracy; however, inadvertent computerized transcription errors may be present    Norman Clay 07/01/21 2012    Terald Sleeper, MD 07/01/21 2340

## 2021-07-01 NOTE — ED Provider Notes (Addendum)
[facility] EMERGENCY DEPARTMENT Provider Note   CSN: [id number] Arrival date & time: 07/01/21  1945     History Chief Complaint  Patient presents with   Finger Injury    Alexander Coleman is a 45 y.o. male who is right-handed presented ED with a laceration of the left third finger.  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  He was bleeding at the time the bleeding is stopped.  He is not sure of his last tetanus shot.  HPI     History reviewed. No pertinent past medical history.  There are no problems to display for this patient.   Past Surgical History:  Procedure Laterality Date   TONSILLECTOMY         Family History  Family history unknown: Yes    Social History   Tobacco Use   Smoking status: Every Day    Packs/day: 3.00    Types: Cigarettes   Smokeless tobacco: Never  Substance Use Topics   Alcohol use: Not Currently   Drug use: Never    Home Medications Prior to Admission medications   Medication Sig Start Date End Date Taking? Authorizing Provider  cephALEXin (KEFLEX) 500 MG capsule Take 1 capsule (500 mg total) by mouth 2 (two) times daily for 5 days. 07/02/21 07/07/21 Yes Markelle Asaro, Kermit Balo, MD  HYDROcodone-acetaminophen (NORCO/VICODIN) 5-325 MG tablet Take 1 tablet by mouth every 4 (four) hours as needed. 02/20/18   Sabas Sous, MD  ibuprofen (ADVIL,MOTRIN) 800 MG tablet Take 800 mg by mouth every 8 (eight) hours as needed for mild pain.    [provider]  loratadine (CLARITIN) 10 MG tablet Take 10 mg by mouth daily.    [provider]    Allergies    Patient has no known allergies.  Review of Systems   Review of Systems  Constitutional:  Negative for chills and fever.  Musculoskeletal:  Positive for arthralgias and myalgias.  Skin:  Positive for rash and wound.  Neurological:  Negative for seizures and syncope.  All other systems reviewed and are negative.  Physical  Exam Updated Vital Signs BP (!) 137/96 (BP Location: Right Arm)    Pulse 90    Temp 98.5 F (36.9 C) (Oral)    Resp 18    Ht 6' (1.829 m)    Wt 98 kg    SpO2 94%    BMI 29.30 kg/m   Physical Exam Constitutional:      General: He is not in acute distress. HENT:     Head: Normocephalic and atraumatic.  Eyes:     Conjunctiva/sclera: Conjunctivae normal.     Pupils: Pupils are equal, round, and reactive to light.  Cardiovascular:     Rate and Rhythm: Regular rhythm. Tachycardia present.     Pulses: Normal pulses.  Pulmonary:     Effort: Pulmonary effort is normal. No respiratory distress.  Skin:    General: Skin is warm and dry.     Comments: 0.5 cm laceration left distal finger, no active bleed Full ROM of finger testing  Neurological:     General: No focal deficit present.     Mental Status: He is alert and oriented to person, place, and time. Mental status is at baseline.    ED Results / Procedures / Treatments   Labs (all labs ordered are listed, but only abnormal results are displayed) Labs Reviewed - No data to display  EKG None  Radiology  DG Finger Middle Left  Result Date: 07/01/2021 CLINICAL DATA:  Laceration EXAM: LEFT MIDDLE FINGER 2+V COMPARISON:  None. FINDINGS: Possible tiny fracture at the volar base of the distal phalanx. No radiopaque foreign body. IMPRESSION: Possible tiny fracture at the volar base of the distal phalanx. Electronically Signed   By: Jasmine Pang M.D.   On: 07/01/2021 20:31    Procedures Procedures   Medications Ordered in ED Medications  ibuprofen (ADVIL) tablet 600 mg (600 mg Oral Given 07/01/21 2254)  acetaminophen (TYLENOL) tablet 650 mg (650 mg Oral Given 07/01/21 2254)  cephALEXin (KEFLEX) capsule 500 mg (500 mg Oral Given 07/01/21 2253)    ED Course  I have reviewed the triage vital signs and the nursing notes.  Pertinent labs & imaging results that were available during my care of the patient were reviewed by me and  considered in my medical decision making (see chart for details).  We will update the patient's tetanus.  No active bleeding.  He is neurovascularly intact.  Full range of motion.  Doubt tendon injury.  X-ray reviewed showing a small chip fracture.  We will irrigate the wound, dressed with a bandage, placed the patient in a finger splint.  This is too small and narrow for suturing  Clinical Course as of 07/02/21 0026  Thu Jul 01, 2021  2237 Correction the patient had a tetanus in 2019, will discontinue the tetanus [MT]    Clinical Course User Index [MT] Arlyn Bumpus, Kermit Balo, MD     Final Clinical Impression(s) / ED Diagnoses Final diagnoses:  Laceration of left middle finger without foreign body without damage to nail, initial encounter  Nondisplaced fracture of distal phalanx of left middle finger, initial encounter for closed fracture    Rx / DC Orders ED Discharge Orders          Ordered    cephALEXin (KEFLEX) 500 MG capsule  2 times daily        07/01/21 2307             Terald Sleeper, MD 07/01/21 2344    Terald Sleeper, MD 07/02/21 [phone]

## 2021-07-01 NOTE — ED Notes (Signed)
Laceration to the left hand.

## 2021-07-01 NOTE — ED Notes (Signed)
Wound was irrigated with normal saline and cleaned with wound cleaner. Finger was dried and wrapped with guaze.

## 2021-07-01 NOTE — Progress Notes (Signed)
Orthopedic Tech Progress Note Patient Details:  Gaege Sangalang [birth year] [id number]  Ortho Devices Type of Ortho Device: Finger splint Ortho Device/Splint Location: LUE Ortho Device/Splint Interventions: Ordered, Application, Adjustment   Post Interventions Patient Tolerated: Well Instructions Provided: Adjustment of device, Care of device, Poper ambulation with device  Danniela Mcbrearty 07/01/2021, 11:01 PM

## 2021-07-01 NOTE — Discharge Instructions (Signed)
Your x-rays show that there is a very small chip fracture at the end of your finger where you cut it.    The wound was too small for stitches.  We washed it here and applied a Band-Aid.  It should close up over the next few days.  To prevent infection I prescribed an antibiotic for the next 5 days.  Please keep your hand dry and clean.  You should avoid soaking in dirty water getting it dirty.

## 2021-07-01 NOTE — ED Triage Notes (Signed)
Pt arrives POV for eval of laceration w/ knife to R middle finger. Tetanus UTD. Dsg left in place in triage

## 2021-10-21 ENCOUNTER — Encounter (HOSPITAL_COMMUNITY): Payer: Self-pay

## 2021-10-21 ENCOUNTER — Emergency Department (HOSPITAL_COMMUNITY)
Admission: EM | Admit: 2021-10-21 | Discharge: 2021-10-21 | Disposition: A | Discharge status: Home / Self Care | Payer: No Typology Code available for payment source | Attending: Emergency Medicine | Admitting: Emergency Medicine

## 2021-10-21 DIAGNOSIS — Z23 Encounter for immunization: Secondary | ICD-10-CM | POA: Diagnosis not present

## 2021-10-21 DIAGNOSIS — S51812A Laceration without foreign body of left forearm, initial encounter: Secondary | ICD-10-CM | POA: Diagnosis not present

## 2021-10-21 DIAGNOSIS — W260XXA Contact with knife, initial encounter: Secondary | ICD-10-CM | POA: Insufficient documentation

## 2021-10-21 DIAGNOSIS — S59912A Unspecified injury of left forearm, initial encounter: Secondary | ICD-10-CM | POA: Diagnosis present

## 2021-10-21 MED ORDER — ACETAMINOPHEN 325 MG PO TABS
650.0000 mg | ORAL_TABLET | Freq: Once | ORAL | Status: AC
  Administered 2021-10-21: 650 mg via ORAL
  Filled 2021-10-21: qty 2

## 2021-10-21 MED ORDER — LIDOCAINE-EPINEPHRINE (PF) 2 %-1:200000 IJ SOLN
10.0000 mL | Freq: Once | INTRAMUSCULAR | Status: AC
  Administered 2021-10-21: 10 mL
  Filled 2021-10-21: qty 20

## 2021-10-21 MED ORDER — LIDOCAINE-EPINEPHRINE-TETRACAINE (LET) TOPICAL GEL
3.0000 mL | Freq: Once | TOPICAL | Status: AC
  Administered 2021-10-21: 3 mL via TOPICAL
  Filled 2021-10-21: qty 3

## 2021-10-21 MED ORDER — TETANUS-DIPHTH-ACELL PERTUSSIS 5-2.5-18.5 LF-MCG/0.5 IM SUSY
0.5000 mL | PREFILLED_SYRINGE | Freq: Once | INTRAMUSCULAR | Status: AC
  Administered 2021-10-21: 0.5 mL via INTRAMUSCULAR
  Filled 2021-10-21: qty 0.5

## 2021-10-21 MED ORDER — IPRATROPIUM-ALBUTEROL 0.5-2.5 (3) MG/3ML IN SOLN
3.0000 mL | Freq: Once | RESPIRATORY_TRACT | Status: DC
  Filled 2021-10-21: qty 3

## 2021-10-21 MED ORDER — CEPHALEXIN 500 MG PO CAPS: 500.0000 mg | ORAL_CAPSULE | Freq: Three times a day (TID) | ORAL | 0 refills | Status: AC

## 2021-10-21 MED ORDER — IBUPROFEN 400 MG PO TABS
600.0000 mg | ORAL_TABLET | Freq: Once | ORAL | Status: AC
  Administered 2021-10-21: 600 mg via ORAL
  Filled 2021-10-21: qty 1

## 2021-10-21 NOTE — ED Provider Notes (Signed)
?[facility] ?Provider Note ? ? ?CSN: [id number] ?Arrival date & time: 10/21/21  1637 ? ?  ? ?History ?Chief Complaint  ?Patient presents with  ? Laceration  ? ? ?Alexander Coleman is a 46 y.o. male who is right-handed presenting for laceration to his left anterior forearm from a knife.  Patient was trying to sharpening a knife and a hand-held knife sharpener when the knife slipped and cut him in the wrist.  He has been able to feel all of his fingers and use his hand without difficulty.  Patient reports a lot of bleeding initially.  EMS arrived and put a pressure dressing on which stopped the bleeding. ? ? ?Laceration ? ?  ? ?Home Medications ?Prior to Admission medications   ?Medication Sig Start Date End Date Taking? Authorizing Provider  ?cephALEXin (KEFLEX) 500 MG capsule Take 1 capsule (500 mg total) by mouth 3 (three) times daily for 7 days. 10/21/21 10/28/21 Yes Micheline MazeKiehl, Zaynah Chawla, MD  ?HYDROcodone-acetaminophen (NORCO/VICODIN) 5-325 MG tablet Take 1 tablet by mouth every 4 (four) hours as needed. 02/20/18   Sabas SousBero, Michael M, MD  ?ibuprofen (ADVIL,MOTRIN) 800 MG tablet Take 800 mg by mouth every 8 (eight) hours as needed for mild pain.    [provider]  ?loratadine (CLARITIN) 10 MG tablet Take 10 mg by mouth daily.    [provider]  ?   ? ?Allergies    ?Patient has no known allergies.   ? ?Review of Systems   ?Review of Systems  ?Skin:  Positive for wound.  ? ?Physical Exam ?Updated Vital Signs ?BP 137/90   Pulse 79   Temp 98 ?F (36.7 ?C) (Oral)   Resp 15   Ht 6' (1.829 m)   Wt 98 kg   SpO2 96%   BMI 29.30 kg/m?  ?Physical Exam ?Constitutional:   ?   General: He is not in acute distress. ?Eyes:  ?   Pupils: Pupils are equal, round, and reactive to light.  ?Cardiovascular:  ?   Rate and Rhythm: Normal rate and regular rhythm.  ?   Pulses: Normal pulses.  ?Abdominal:  ?   Tenderness: There is no abdominal tenderness. There is no guarding or rebound.   ?Musculoskeletal:  ?   Comments: 4 cm laceration over anterior aspect of forearm just distal to the wrist ?Hemostatic.  No visibly injured tendons.  Able to well visualize the base of the wound and see no foreign bodies.  Full range of motion and strength of wrist and fingers.  Sensation intact in all fingers and hand.  ?Neurological:  ?   General: No focal deficit present.  ?   Mental Status: He is alert and oriented to person, place, and time.  ?Psychiatric:     ?   Mood and Affect: Mood normal.     ?   Behavior: Behavior normal.  ? ? ?ED Results / Procedures / Treatments   ?Labs ?(all labs ordered are listed, but only abnormal results are displayed) ?Labs Reviewed - No data to display ? ?EKG ?None ? ?Radiology ?No results found. ? ?Procedures ?Marland Kitchen..Laceration Repair ? ?Date/Time: 10/21/2021 8:52 PM ?Performed by: Micheline MazeKiehl, Kylen Schliep, MD ?Authorized by: Maia PlanLong, Joshua G, MD  ? ?Consent:  ?  Consent obtained:  Verbal ?  Consent given by:  Patient ?  Risks discussed:  Infection, pain, retained foreign body, tendon damage, poor cosmetic result, need for additional repair, nerve damage, poor wound healing and vascular damage ?  Alternatives discussed:  No treatment and observation ?Universal protocol:  ?  Procedure explained and questions answered to patient or proxy's satisfaction: yes   ?  Relevant documents present and verified: yes   ?  Site/side marked: yes   ?  Immediately prior to procedure, a time out was called: yes   ?  Patient identity confirmed:  Verbally with patient ?Anesthesia:  ?  Anesthesia method:  Topical application ?Laceration details:  ?  Location:  Hand ?  Hand location:  L wrist ?  Length (cm):  4 ?  Depth (mm):  2 ?Pre-procedure details:  ?  Preparation:  Patient was prepped and draped in usual sterile fashion ?Exploration:  ?  Limited defect created (wound extended): no   ?  Wound exploration: wound explored through full range of motion and entire depth of wound visualized   ?  Wound extent: no foreign  bodies/material noted, no muscle damage noted, no nerve damage noted, no tendon damage noted, no underlying fracture noted and no vascular damage noted   ?  Contaminated: no   ?Treatment:  ?  Area cleansed with:  Saline ?  Amount of cleaning:  Extensive ?  Irrigation solution:  Tap water ?  Irrigation method:  Syringe ?  Visualized foreign bodies/material removed: no   ?  Debridement:  None ?  Undermining:  None ?  Scar revision: no   ?  Layers/structures repaired:  Deep dermal/superficial fascia ?Deep dermal/superficial fascia:  ?  Suture size:  5-0 ?  Deep dermal/superficial fascia suture material: Ethilon. ?  Suture technique:  Simple interrupted ?  Number of sutures:  5 ?Skin repair:  ?  Repair method:  Sutures ?  Suture size:  5-0 ?  Wound skin closure material used: Ethilon. ?  Number of sutures:  5 ?Approximation:  ?  Approximation:  Close ?Repair type:  ?  Repair type:  Simple ?Post-procedure details:  ?  Dressing:  Antibiotic ointment ?  Procedure completion:  Tolerated well, no immediate complications  ? ? ?Medications Ordered in ED ?Medications  ?lidocaine-EPINEPHrine-tetracaine (LET) topical gel (3 mLs Topical Given 10/21/21 1717)  ?ibuprofen (ADVIL) tablet 600 mg (600 mg Oral Given 10/21/21 1717)  ?acetaminophen (TYLENOL) tablet 650 mg (650 mg Oral Given 10/21/21 1717)  ?lidocaine-EPINEPHrine (XYLOCAINE W/EPI) 2 %-1:200000 (PF) injection 10 mL (10 mLs Infiltration Given by Other 10/21/21 1722)  ?Tdap (BOOSTRIX) injection 0.5 mL (0.5 mLs Intramuscular Given 10/21/21 1717)  ? ? ?ED Course/ Medical Decision Making/ A&P ?  ?                        ?Medical Decision Making ?Risk ?OTC drugs. ?Prescription drug management. ? ? ?46 year old male with past medical history of prior crush injuries to his left third finger (per chart review) presenting to the ED for laceration to his left forearm s/p sharpening knives. ? ?On exam, neurovascular intact.  Sensation and motor intact.  Due to well-visualized injury and  superficial nature, no x-ray needed to ensure there is no foreign bodies.  Due to no systemic symptoms, no labs needed at this time.  Wound washed out well by nursing.  After this patient reports that his ring finger now feels numb.  Patient removed his ring with minimal improvement.  No noted nerve injury.  Sensation intact to his pinky decreasing suspicion of ulnar nerve injury.  Sensation intact other fingers decreasing suspicion of other nerve injuries.  Hand spoken with who recommends outpatient follow-up if the numbness  continues but agrees likelihood of ulnar nerve injury is low given given superficial nature of the wound.  Patient's motor remains intact.  Strong radial pulse.  Dr. Izora Ribas with hand recommends outpatient follow-up if sensation continues to be decreased and starting on antibiotics prevent infection.  This information was relayed to the patient.  Unknown last tetanus.  Tetanus updated. ? ?Wound repaired with 5 stitches without complication.  Patient recommended to follow-up with his primary care provider in 7 to 10 days for suture removal.  Recommending keeping the area clean and dry.  Strict return precautions given.  Patient verbalized agreement understand with plan. ? ?Patient seen in conjunction with my attending Dr. Jacqulyn Bath. ? ?Final Clinical Impression(s) / ED Diagnoses ?Final diagnoses:  ?Laceration of left forearm, initial encounter  ? ? ?Rx / DC Orders ?ED Discharge Orders   ? ?      Ordered  ?  cephALEXin (KEFLEX) 500 MG capsule  3 times daily       ? 10/21/21 1858  ? ?  ?  ? ?  ? ? ?  ?Micheline Maze, MD ?10/21/21 2057 ? ?  ?Maia Plan, MD ?10/22/21 1228 ? ?

## 2021-10-21 NOTE — ED Triage Notes (Signed)
Pt was sharpening a knife at work when he accidentally lacerated left wrist. EMS reports approx 3 inches diagonal cut to left wrist. Work related injury. Bleeding controlled with pressure dressing. Good cap refill to left upper extremity. Reports tingling to fingers.  ?

## 2021-10-21 NOTE — Discharge Instructions (Addendum)
You were evaluated in the Emergency Department and after careful evaluation, we did not find any emergent condition requiring admission or further testing in the hospital. ? ?Your exam/testing today was overall reassuring.  Please watch for signs and symptoms of infection.  Please seek medical care if you notice redness, warmth, discolored discharge, tenderness to the site, fevers, decreased range of motion of your hand, decreased sensation to your hand.  Please keep the area clean and dry.  Do not submerge your hand in water.  You can have water trickle over the site but do not keep it under water for long periods of time.  You can take Tylenol and Motrin as needed for pain control.  Please follow-up with a primary care provider, urgent care, or emergency department for suture removal in 7 to 10 days. ? ?Please follow-up with hand surgery due to numbness in your ring finger. ? ?Please return to the Emergency Department if you experience any worsening of your condition.  Thank you for allowing [place] to be a part of your care.  ?

## 2021-10-25 ENCOUNTER — Ambulatory Visit: Payer: Self-pay | Admitting: *Deleted

## 2021-10-25 NOTE — Telephone Encounter (Signed)
I returned pt's call..  He had stitches put in in the ED and needs to schedule an appt to have them removed in 7-10 days.  ED gave him the number to [facility].   It's a workman's comp. Claim.    ?No appts available. ? ?Left message to return call to [place] at [phone] any nurse will be glad to assist you. ? ? ? ?

## 2021-10-25 NOTE — Telephone Encounter (Signed)
Pt is in need of an appt to have stitches removed 7-10 days after put in. Deep laceration was stitched 10/21/21. No appts available. [place] assisted.  ?Pt wants an appt with a PCP also. This workmen's comp. Pt was written out until today and pt concerned abouthis work and a note to keep him out longer.  ?Forwarding to [facility].  ?Reason for Disposition ? Requesting regular office appointment ?   Needs PCP ? ?Protocols used: Information Only Call - No Triage-A-AH ? ?

## 2022-01-21 ENCOUNTER — Emergency Department (HOSPITAL_COMMUNITY)
Admission: EM | Admit: 2022-01-21 | Discharge: 2022-01-22 | Disposition: A | Discharge status: Home / Self Care | Payer: Self-pay | Attending: Emergency Medicine | Admitting: Emergency Medicine

## 2022-01-21 ENCOUNTER — Encounter (HOSPITAL_COMMUNITY): Payer: Self-pay | Admitting: Emergency Medicine

## 2022-01-21 ENCOUNTER — Other Ambulatory Visit: Payer: Self-pay

## 2022-01-21 DIAGNOSIS — R202 Paresthesia of skin: Secondary | ICD-10-CM | POA: Insufficient documentation

## 2022-01-21 DIAGNOSIS — R531 Weakness: Secondary | ICD-10-CM

## 2022-01-21 DIAGNOSIS — G459 Transient cerebral ischemic attack, unspecified: Secondary | ICD-10-CM

## 2022-01-21 NOTE — ED Triage Notes (Signed)
Pt c/o left hand and arm numbness, and left sided facial tingling that started at 2120 tonight while driving home. Pt reports symptoms have now resolved. States he had a headache yesterday, c/o chest tightness and feeling "shaky" at this time.

## 2022-01-21 NOTE — ED Provider Triage Note (Signed)
Emergency Medicine Provider Triage Evaluation Note  Alexander Coleman , a 46 y.o. male  was evaluated in triage.  Pt complains of left-sided numbness that occurred while he was driving and lasted for about 5 minutes.  Reports full resolution of the numbness and weakness on the left side.  Currently he states he is at baseline. He does report numbness of the ring finger on the left hand but states that is chronic and due to a work-related injury.  States since the episode he is having left-sided chest tightness.  Review of Systems  Positive: As above Negative: As above  Physical Exam  BP (!) 149/89   Pulse 83   Temp 98.5 F (36.9 C) (Oral)   Resp 18   SpO2 98%  Gen:   Awake, no distress   Resp:  Normal effort  MSK:   Moves extremities without difficulty  Other:  Cranial nerves III through XII intact.  Full range of motion bilateral upper and lower extremities.  5/5 strength and extensor and flexor muscle groups of upper and lower extremities.  Sensation intact and symmetrical bilaterally.  Tongue midline.  Without pronator drift.  Medical Decision Making  Medically screening exam initiated at 11:52 PM.  Appropriate orders placed.  Lerone Onder was informed that the remainder of the evaluation will be completed by another provider, this initial triage assessment does not replace that evaluation, and the importance of remaining in the ED until their evaluation is complete.     Marita [place], PA-C 01/21/22 2354

## 2022-01-22 ENCOUNTER — Emergency Department (HOSPITAL_COMMUNITY): Payer: Self-pay

## 2022-01-22 ENCOUNTER — Encounter (HOSPITAL_COMMUNITY): Payer: Self-pay | Admitting: Radiology

## 2022-01-22 LAB — CBC WITH DIFFERENTIAL/PLATELET
Abs Immature Granulocytes: 0.02 10*3/uL (ref 0.00–0.07)
Basophils Absolute: 0.1 10*3/uL (ref 0.0–0.1)
Basophils Relative: 1 %
Eosinophils Absolute: 0.3 10*3/uL (ref 0.0–0.5)
Eosinophils Relative: 3 %
HCT: 48.9 % (ref 39.0–52.0)
Hemoglobin: 17 g/dL (ref 13.0–17.0)
Immature Granulocytes: 0 %
Lymphocytes Relative: 27 %
Lymphs Abs: 2.9 10*3/uL (ref 0.7–4.0)
MCH: 29.7 pg (ref 26.0–34.0)
MCHC: 34.8 g/dL (ref 30.0–36.0)
MCV: 85.3 fL (ref 80.0–100.0)
Monocytes Absolute: 0.9 10*3/uL (ref 0.1–1.0)
Monocytes Relative: 9 %
Neutro Abs: 6.5 10*3/uL (ref 1.7–7.7)
Neutrophils Relative %: 60 %
Platelets: 410 10*3/uL — ABNORMAL HIGH (ref 150–400)
RBC: 5.73 MIL/uL (ref 4.22–5.81)
RDW: 12.3 % (ref 11.5–15.5)
WBC: 10.8 10*3/uL — ABNORMAL HIGH (ref 4.0–10.5)
nRBC: 0 % (ref 0.0–0.2)

## 2022-01-22 LAB — COMPREHENSIVE METABOLIC PANEL
ALT: 21 U/L (ref 0–44)
AST: 20 U/L (ref 15–41)
Albumin: 4.2 g/dL (ref 3.5–5.0)
Alkaline Phosphatase: 77 U/L (ref 38–126)
Anion gap: 10 (ref 5–15)
BUN: 11 mg/dL (ref 6–20)
CO2: 24 mmol/L (ref 22–32)
Calcium: 9.3 mg/dL (ref 8.9–10.3)
Chloride: 102 mmol/L (ref 98–111)
Creatinine, Ser: 1.05 mg/dL (ref 0.61–1.24)
GFR, Estimated: >60 mL/min (ref >60)
Glucose, Bld: 99 mg/dL (ref 70–99)
Potassium: 3.6 mmol/L (ref 3.5–5.1)
Sodium: 136 mmol/L (ref 135–145)
Total Bilirubin: 1 mg/dL (ref 0.3–1.2)
Total Protein: 7.2 g/dL (ref 6.5–8.1)

## 2022-01-22 LAB — URINALYSIS, ROUTINE W REFLEX MICROSCOPIC
Bilirubin Urine: NEGATIVE
Glucose, UA: NEGATIVE mg/dL
Hgb urine dipstick: NEGATIVE
Ketones, ur: NEGATIVE mg/dL
Leukocytes,Ua: NEGATIVE
Nitrite: NEGATIVE
Protein, ur: NEGATIVE mg/dL
Specific Gravity, Urine: 1.002 — ABNORMAL LOW (ref 1.005–1.030)
pH: 7 (ref 5.0–8.0)

## 2022-01-22 LAB — TROPONIN I (HIGH SENSITIVITY)
Troponin I (High Sensitivity): 4 ng/L (ref <18)
Troponin I (High Sensitivity): 4 ng/L (ref <18)

## 2022-01-22 MED ORDER — NICOTINE 7 MG/24HR TD PT24: 7.0000 mg | MEDICATED_PATCH | Freq: Every day | TRANSDERMAL | 0 refills | Status: DC

## 2022-01-22 MED ORDER — IOHEXOL 350 MG/ML SOLN
50.0000 mL | Freq: Once | INTRAVENOUS | Status: AC | PRN
  Administered 2022-01-22: 50 mL via INTRAVENOUS

## 2022-01-22 MED ORDER — ASPIRIN 81 MG PO CHEW: 81.0000 mg | CHEWABLE_TABLET | Freq: Every day | ORAL | 1 refills | Status: DC

## 2022-01-22 MED ORDER — CLOPIDOGREL BISULFATE 75 MG PO TABS: 75.0000 mg | ORAL_TABLET | Freq: Every day | ORAL | 1 refills | Status: DC

## 2022-01-22 NOTE — ED Provider Notes (Signed)
[facility] EMERGENCY DEPARTMENT Provider Note   CSN: [id number] Arrival date & time: 01/21/22  2318     History  Chief Complaint  Patient presents with   Numbness    Alexander Coleman is a 46 y.o. male.  46 yo M with a cc of left sided numbness. He was driving in his car and realized that his left arm felt numb.  He tried to lift it up and realized he couldn't. Felt like his left leg was also weak and felt like he could not get it to do what he wanted it to and then felt like the left side of his face was drooping.  He thinks this lasted for about 4 minutes and then resolved.  He is felt a little bit of anxiety with this and feels like his chest is little bit tired afterwards.  He has some chronic tingling to his left hand and foot and that still remains but he feels like the rest of the numbness has resolved.  He denied any head or neck injury.  Nothing seems to make the chest discomfort better or worse.        Home Medications Prior to Admission medications   Medication Sig Start Date End Date Taking? Authorizing Provider  HYDROcodone-acetaminophen (NORCO/VICODIN) 5-325 MG tablet Take 1 tablet by mouth every 4 (four) hours as needed. 02/20/18   Sabas Sous, MD  ibuprofen (ADVIL,MOTRIN) 800 MG tablet Take 800 mg by mouth every 8 (eight) hours as needed for mild pain.    [provider]  loratadine (CLARITIN) 10 MG tablet Take 10 mg by mouth daily.    [provider]      Allergies    Patient has no known allergies.    Review of Systems   Review of Systems  Physical Exam Updated Vital Signs BP 119/85   Pulse 73   Temp 98.5 F (36.9 C) (Oral)   Resp 18   SpO2 93%  Physical Exam Vitals and nursing note reviewed.  Constitutional:      Appearance: He is well-developed.  HENT:     Head: Normocephalic and atraumatic.  Eyes:     Pupils: Pupils are equal, round, and reactive to light.  Neck:     Vascular: No JVD.  Cardiovascular:      Rate and Rhythm: Normal rate and regular rhythm.     Heart sounds: No murmur heard.    No friction rub. No gallop.  Pulmonary:     Effort: No respiratory distress.     Breath sounds: No wheezing.  Abdominal:     General: There is no distension.     Tenderness: There is no abdominal tenderness. There is no guarding or rebound.  Musculoskeletal:        General: Normal range of motion.     Cervical back: Normal range of motion and neck supple.  Skin:    Coloration: Skin is not pale.     Findings: No rash.  Neurological:     Mental Status: He is alert and oriented to person, place, and time.     Comments: Benign neuro exam.   Psychiatric:        Behavior: Behavior normal.     ED Results / Procedures / Treatments   Labs (all labs ordered are listed, but only abnormal results are displayed) Labs Reviewed  CBC WITH DIFFERENTIAL/PLATELET - Abnormal; Notable for the following components:      Result Value   WBC 10.8 (*)  Platelets 410 (*)    All other components within normal limits  URINALYSIS, ROUTINE W REFLEX MICROSCOPIC - Abnormal; Notable for the following components:   Color, Urine COLORLESS (*)    Specific Gravity, Urine 1.002 (*)    All other components within normal limits  COMPREHENSIVE METABOLIC PANEL  TROPONIN I (HIGH SENSITIVITY)  TROPONIN I (HIGH SENSITIVITY)    EKG EKG Interpretation  Date/Time:  Friday January 21 2022 23:42:35 EDT Ventricular Rate:  93 PR Interval:  144 QRS Duration: 86 QT Interval:  334 QTC Calculation: 415 R Axis:   100 Text Interpretation: Normal sinus rhythm with sinus arrhythmia Rightward axis Borderline ECG No old tracing to compare Confirmed by Melene Plan [phone]) on 01/22/2022 2:41:19 AM  Radiology CT Head Wo Contrast  Result Date: 01/22/2022 CLINICAL DATA:  Left hand and arm numbness EXAM: CT HEAD WITHOUT CONTRAST TECHNIQUE: Contiguous axial images were obtained from the base of the skull through the vertex without intravenous  contrast. RADIATION DOSE REDUCTION: This exam was performed according to the departmental dose-optimization program which includes automated exposure control, adjustment of the mA and/or kV according to patient size and/or use of iterative reconstruction technique. COMPARISON:  CT 03/16/2016 FINDINGS: Brain: No acute territorial infarction, hemorrhage, or intracranial mass. The ventricles are non enlarged. Vascular: No hyperdense vessels.  No unexpected calcification. Skull: Normal. Negative for fracture or focal lesion. Sinuses/Orbits: Patchy mucosal thickening in the sinuses. Other: None IMPRESSION: Negative non contrasted CT appearance of the brain. Electronically Signed   By: Jasmine Pang M.D.   On: 01/22/2022 00:15    Procedures Procedures    Medications Ordered in ED Medications  iohexol (OMNIPAQUE) 350 MG/ML injection 50 mL (has no administration in time range)    ED Course/ Medical Decision Making/ A&P                           Medical Decision Making Amount and/or Complexity of Data Reviewed Radiology: ordered.   46 yo M with a chief complaints of acute onset left-sided numbness and weakness.  This lasted for about 4 minutes and then resolved.  Patient denies history of diabetes but has a significant smoking history and smokes 3 packs a day.  He has never had a stroke or heart attack before.  His symptoms are very concerning for a TIA.  I discussed this with neurology on-call, Dr. Amada Jupiter.  He recommended a CT angiogram of the head and neck as well as an MRI of the brain.  If these were unremarkable then with a low ABCD 2 score he felt he could be discharged on 3 weeks of aspirin and Plavix.  In given outpatient neurology follow-up.  In the MSE process the patient had 2 troponins ordered that were negative.  He has a mild leukocytosis of 10.8.  No anemia.  No significant electrolyte abnormality.   Signed out to Dr. Wallace Cullens, please see their note for further details of care in the  ED.  The patients results and plan were reviewed and discussed.   Any x-rays performed were independently reviewed by myself.   Differential diagnosis were considered with the presenting HPI.  Medications  iohexol (OMNIPAQUE) 350 MG/ML injection 50 mL (has no administration in time range)    Vitals:   01/22/22 0345 01/22/22 0400 01/22/22 0415 01/22/22 0635  BP: 136/75  137/77 119/85  Pulse: 72 75 71 73  Resp: 18 16 19 18   Temp:  TempSrc:      SpO2: 96% 95% 94% 93%    Final diagnoses:  Left-sided weakness    Admission/ observation were discussed with the admitting physician, patient and/or family and they are comfortable with the plan.         Final Clinical Impression(s) / ED Diagnoses Final diagnoses:  Left-sided weakness    Rx / DC Orders ED Discharge Orders     None         Melene Plan, DO 01/22/22 5597

## 2022-01-22 NOTE — Progress Notes (Signed)
Waiting for IV placement for CT scan since 0311.

## 2022-01-22 NOTE — ED Provider Notes (Signed)
7:06 AM Patient signed out to me by previous ED physician. 46 yo male presenting for numbness and weakness in the left upper extremity, left lower extremity, and left face lasting 4 min then resolving. 3 pac a day smoker. Otherwise no HTN, hyperlip, or hx of cardiac disease.  Plan:  MRI then DC home with plavix and asa.    Physical Exam  BP 119/85   Pulse 73   Temp 98.5 F (36.9 C) (Oral)   Resp 18   SpO2 93%   Physical Exam Vitals and nursing note reviewed.  Constitutional:      General: He is not in acute distress.    Appearance: He is well-developed.  HENT:     Head: Normocephalic and atraumatic.  Eyes:     Conjunctiva/sclera: Conjunctivae normal.  Cardiovascular:     Rate and Rhythm: Normal rate and regular rhythm.     Heart sounds: No murmur heard. Pulmonary:     Effort: Pulmonary effort is normal. No respiratory distress.     Breath sounds: Normal breath sounds.  Abdominal:     Palpations: Abdomen is soft.     Tenderness: There is no abdominal tenderness.  Musculoskeletal:        General: No swelling.     Cervical back: Neck supple.  Skin:    General: Skin is warm and dry.     Capillary Refill: Capillary refill takes less than 2 seconds.  Neurological:     Mental Status: He is alert.     GCS: GCS eye subscore is 4. GCS verbal subscore is 5. GCS motor subscore is 6.     Cranial Nerves: Cranial nerves 2-12 are intact.     Sensory: Sensation is intact.     Motor: Motor function is intact.     Coordination: Coordination is intact.     Gait: Gait is intact.  Psychiatric:        Mood and Affect: Mood normal.     Procedures  Procedures  ED Course / MDM    Medical Decision Making Amount and/or Complexity of Data Reviewed Radiology: ordered.  Risk Prescription drug management.   Has stable CT head and CTA head and neck.  MRI demonstrates no acute process.  Patient's profound numbness and weakness of the left upper extremity, left lower extremity, and the  left side of the face lasting approximately 4 minutes likely secondary to TIA.  Recommended for aspirin and Plavix.  Safe for discharge home at this time.  No repeat neurological symptoms while in ED for the past 6 hours.  Patient in no distress and overall condition improved here in the ED. Detailed discussions were had with the patient regarding current findings, and need for close f/u with PCP or on call doctor. The patient has been instructed to return immediately if the symptoms worsen in any way for re-evaluation. Patient verbalized understanding and is in agreement with current care plan. All questions answered prior to discharge.        Edwin Dada P, DO 01/22/22 1257

## 2022-01-22 NOTE — Discharge Instructions (Signed)
Please stop smoking.  NicoDerm patches sent to your pharmacy.  Today I have concerns that you had a TIA stands for a transient ischemic attack.  This is similar to his stroke in which it causes neurological problems such as sensation or motor dysfunction however resolves spontaneously and does not have any of damaging evidence on MRI scan of the brain.  Due to your frequent smoking you are at a high risk for having a stroke and having life lasting neurological problems.  Please stop smoking at this time and take Plavix and aspirin daily to help decrease your risk.  Gave you a 30-day supply and a refill for 30-day supply.  Please follow-up with your primary care physician or the wellness clinic provided to help continue prescribing these medications.

## 2022-03-25 ENCOUNTER — Ambulatory Visit (HOSPITAL_COMMUNITY)
Admission: RE | Admit: 2022-03-25 | Discharge: 2022-03-25 | Disposition: A | Discharge status: Home / Self Care | Payer: Self-pay | Source: Ambulatory Visit | Attending: Family Medicine | Admitting: Family Medicine

## 2022-03-25 ENCOUNTER — Encounter (HOSPITAL_COMMUNITY): Payer: Self-pay

## 2022-03-25 VITALS — BP 120/81 | HR 80 | Temp 98.0°F | Resp 12 | Wt 225.0 lb

## 2022-03-25 DIAGNOSIS — F17200 Nicotine dependence, unspecified, uncomplicated: Secondary | ICD-10-CM

## 2022-03-25 DIAGNOSIS — Z79899 Other long term (current) drug therapy: Secondary | ICD-10-CM

## 2022-03-25 DIAGNOSIS — Z8673 Personal history of transient ischemic attack (TIA), and cerebral infarction without residual deficits: Secondary | ICD-10-CM

## 2022-03-25 DIAGNOSIS — R062 Wheezing: Secondary | ICD-10-CM

## 2022-03-25 HISTORY — DX: Cerebral infarction, unspecified: I63.9

## 2022-03-25 MED ORDER — ASPIRIN 325 MG PO TBEC: 325.0000 mg | DELAYED_RELEASE_TABLET | Freq: Every day | ORAL | 3 refills | Status: AC

## 2022-03-25 MED ORDER — ALBUTEROL SULFATE HFA 108 (90 BASE) MCG/ACT IN AERS
2.0000 | INHALATION_SPRAY | RESPIRATORY_TRACT | 1 refills | Status: AC | PRN
Start: 2022-03-25 — End: ?

## 2022-03-25 NOTE — ED Provider Notes (Signed)
[facility]    CSN: [id number] Arrival date & time: 03/25/22  0859      History   Chief Complaint Chief Complaint  Patient presents with   Medication Refill    Follow up from ER visit - Entered by patient    HPI Alexander Coleman is a 46 y.o. male.   Patient is here for refill of plavix.  He was seen in the ER in July 2023 and dx with TIA.  Per neuro recommendations he was to be sent out with asa and plavix for 3 weeks, and to follow up with a pcp/neuro.  He states he was given a 3 month supply, and has continued taking plavix.  He feels well overall, but c/o chest congestion as of late.  He has not followed up with neuro or a pcp.  He is also take asa 81mg .  He has been cutting back on tobacco use, smoking 1ppd currently.   Past Medical History:  Diagnosis Date   Stroke [facility])     There are no problems to display for this patient.   Past Surgical History:  Procedure Laterality Date   TONSILLECTOMY         Home Medications    Prior to Admission medications   Medication Sig Start Date End Date Taking? Authorizing Provider  albuterol (VENTOLIN HFA) 108 (90 Base) MCG/ACT inhaler Inhale 2 puffs into the lungs every 4 (four) hours as needed for wheezing or shortness of breath. 03/25/22  Yes Amoura Ransier, 05/25/22, MD  aspirin EC 325 MG tablet Take 1 tablet (325 mg total) by mouth daily. 03/25/22  Yes Anique Beckley, MD  HYDROcodone-acetaminophen (NORCO/VICODIN) 5-325 MG tablet Take 1 tablet by mouth every 4 (four) hours as needed. 02/20/18   04/22/18, MD  ibuprofen (ADVIL,MOTRIN) 800 MG tablet Take 800 mg by mouth every 8 (eight) hours as needed for mild pain.    [provider]  loratadine (CLARITIN) 10 MG tablet Take 10 mg by mouth daily.    [provider]  nicotine (NICODERM CQ) 7 mg/24hr patch Place 1 patch (7 mg total) onto the skin daily. 01/22/22   03/25/22, DO    Family History Family History  Problem Relation Age of Onset   Healthy  Mother     Social History Social History   Tobacco Use   Smoking status: Every Day    Packs/day: 3.00    Types: Cigarettes   Smokeless tobacco: Never  Vaping Use   Vaping Use: Never used  Substance Use Topics   Alcohol use: Not Currently   Drug use: Never     Allergies   Patient has no known allergies.   Review of Systems Review of Systems  Constitutional: Negative.   HENT: Negative.    Respiratory:  Positive for cough.   Cardiovascular: Negative.   Gastrointestinal: Negative.   Genitourinary: Negative.   Musculoskeletal: Negative.   Hematological: Negative.   Psychiatric/Behavioral: Negative.       Physical Exam Triage Vital Signs ED Triage Vitals  Enc Vitals Group     BP 03/25/22 0923 120/81     Pulse Rate 03/25/22 0923 80     Resp 03/25/22 0923 12     Temp 03/25/22 0923 98 F (36.7 C)     Temp Source 03/25/22 0923 Oral     SpO2 03/25/22 0923 98 %     Weight 03/25/22 0920 225 lb (102.1 kg)     Height --  Head Circumference --      Peak Flow --      Pain Score 03/25/22 0920 0     Pain Loc --      Pain Edu? --      Excl. in GC? --    No data found.  Updated Vital Signs BP 120/81 (BP Location: Right Arm)   Pulse 80   Temp 98 F (36.7 C) (Oral)   Resp 12   Wt 102.1 kg   SpO2 98%   BMI 30.52 kg/m   Visual Acuity Right Eye Distance:   Left Eye Distance:   Bilateral Distance:    Right Eye Near:   Left Eye Near:    Bilateral Near:     Physical Exam Constitutional:      Appearance: Normal appearance.  HENT:     Nose: Nose normal.  Cardiovascular:     Rate and Rhythm: Normal rate and regular rhythm.  Pulmonary:     Breath sounds: Wheezing present.  Musculoskeletal:        General: Normal range of motion.  Skin:    General: Skin is warm.  Neurological:     General: No focal deficit present.     Mental Status: He is alert and oriented to person, place, and time.     Cranial Nerves: No cranial nerve deficit.     Sensory: No  sensory deficit.     Coordination: Coordination normal.  Psychiatric:        Mood and Affect: Mood normal.      UC Treatments / Results  Labs (all labs ordered are listed, but only abnormal results are displayed) Labs Reviewed - No data to display  EKG   Radiology No results found.  Procedures Procedures (including critical care time)  Medications Ordered in UC Medications - No data to display  Initial Impression / Assessment and Plan / UC Course  I have reviewed the triage vital signs and the nursing notes.  Pertinent labs & imaging results that were available during my care of the patient were reviewed by me and considered in my medical decision making (see chart for details).    Final Clinical Impressions(s) / UC Diagnoses   Final diagnoses:  History of TIA (transient ischemic attack)  Tobacco use disorder  Wheezing  Medication management     Discharge Instructions      You were seen today for medication management.  You may stop the plavix at this point.  I do recommend you increase your aspirin use to take 325mg  daily.  I have sent this to your pharmacy.  I have sent out an inhaler as well to help with wheezing and chest congestion.  You should continue to cut back on your tobacco use.  I recommend you find a primary care provider for follow up.  Please go to [email or web address] to find a provider near you.    ED Prescriptions     Medication Sig Dispense Auth. Provider   aspirin EC 325 MG tablet Take 1 tablet (325 mg total) by mouth daily. 30 tablet Lamont Glasscock, MD   albuterol (VENTOLIN HFA) 108 (90 Base) MCG/ACT inhaler Inhale 2 puffs into the lungs every 4 (four) hours as needed for wheezing or shortness of breath. 1 each , MD      PDMP not reviewed this encounter.   Jannifer Franklin, MD 03/25/22 [phone]

## 2022-03-25 NOTE — ED Triage Notes (Signed)
Pt is here for medication refill for clopidogrel 75mg 

## 2022-03-25 NOTE — Discharge Instructions (Addendum)
You were seen today for medication management.  You may stop the plavix at this point.  I do recommend you increase your aspirin use to take 325mg  daily.  I have sent this to your pharmacy.  I have sent out an inhaler as well to help with wheezing and chest congestion.  You should continue to cut back on your tobacco use.  I recommend you find a primary care provider for follow up.  Please go to [email or web address] to find a provider near you.

## 2023-05-04 IMAGING — CR DG FINGER MIDDLE 2+V*L*
3 series · 3 of 3 positions shown · non-contrast
Comparison: None.

CLINICAL DATA: Laceration

EXAM:
LEFT MIDDLE FINGER 2+V

[finger ap]
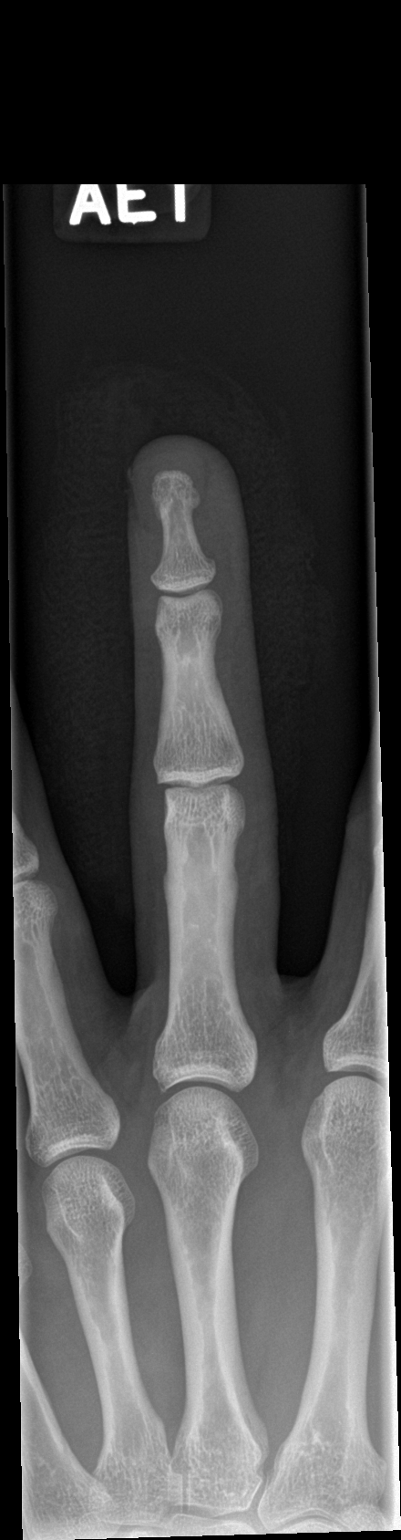

[finger obl]
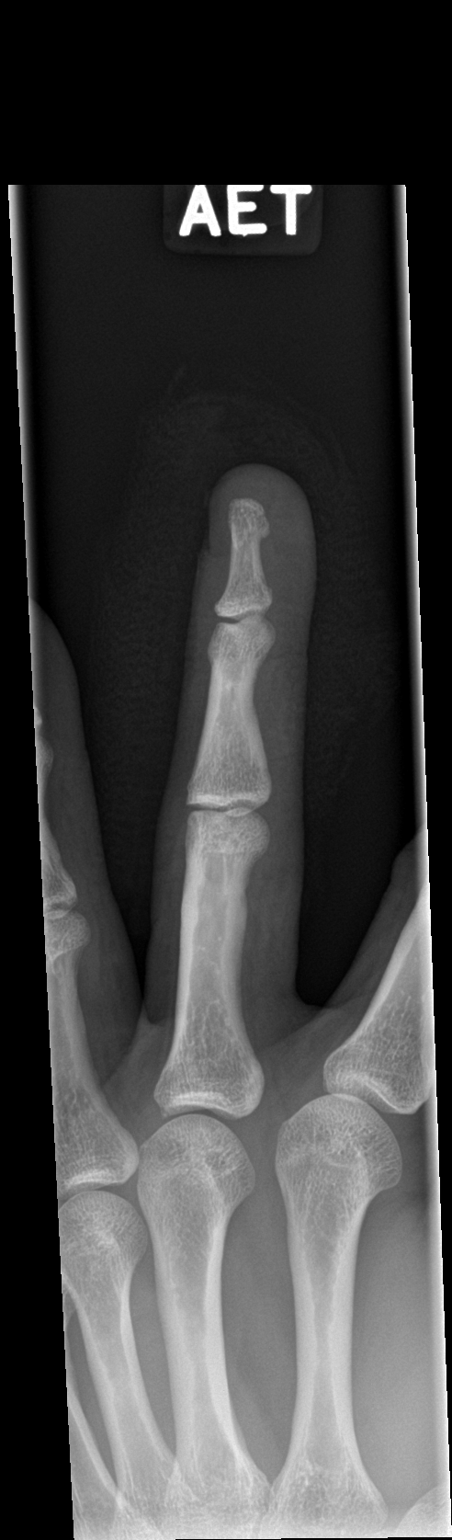

[finger lat]
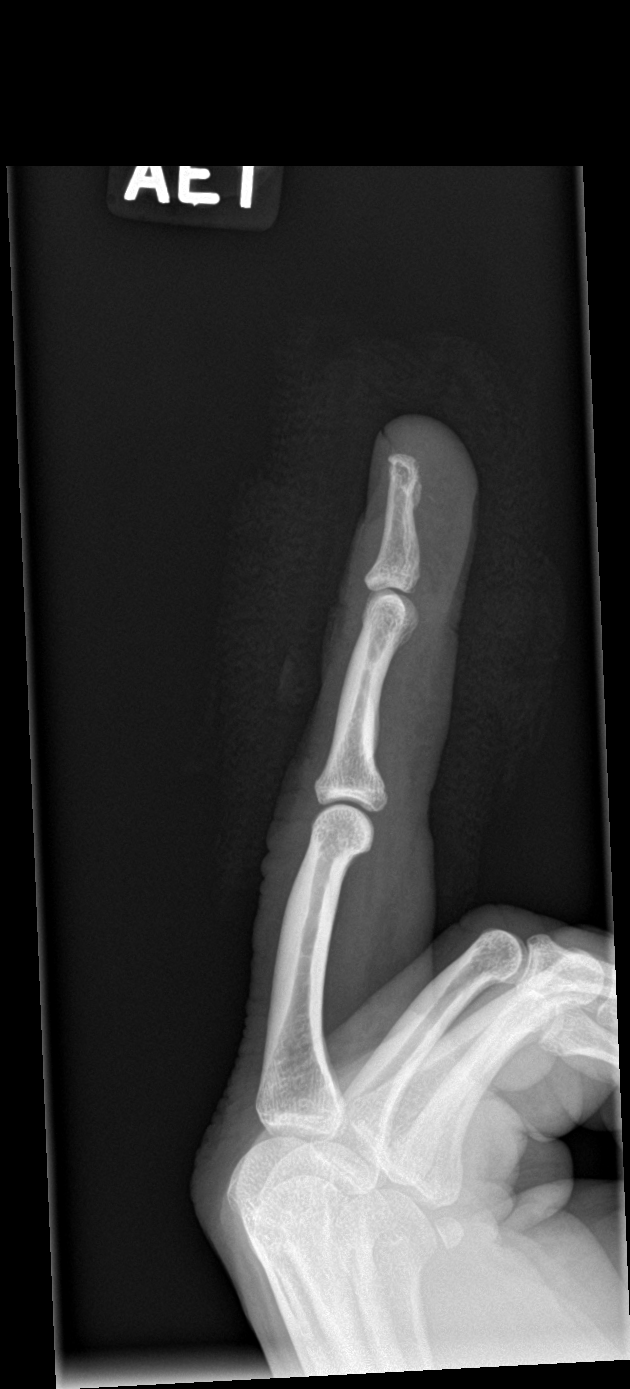

[3 of 3 positions shown; findings below may reference images not displayed]

FINDINGS: Possible tiny fracture at the volar base of the distal phalanx. No
radiopaque foreign body.
IMPRESSION: Possible tiny fracture at the volar base of the distal phalanx.

## 2023-06-06 ENCOUNTER — Other Ambulatory Visit: Payer: Self-pay

## 2023-09-20 ENCOUNTER — Emergency Department (HOSPITAL_COMMUNITY): Payer: Self-pay

## 2023-09-20 ENCOUNTER — Other Ambulatory Visit: Payer: Self-pay

## 2023-09-20 ENCOUNTER — Emergency Department (HOSPITAL_COMMUNITY)
Admission: EM | Admit: 2023-09-20 | Discharge: 2023-09-20 | Disposition: A | Discharge status: Home / Self Care | Payer: Self-pay | Attending: Emergency Medicine | Admitting: Emergency Medicine

## 2023-09-20 DIAGNOSIS — Z20822 Contact with and (suspected) exposure to covid-19: Secondary | ICD-10-CM | POA: Insufficient documentation

## 2023-09-20 DIAGNOSIS — Z7982 Long term (current) use of aspirin: Secondary | ICD-10-CM | POA: Insufficient documentation

## 2023-09-20 DIAGNOSIS — B349 Viral infection, unspecified: Secondary | ICD-10-CM | POA: Insufficient documentation

## 2023-09-20 LAB — CBC WITH DIFFERENTIAL/PLATELET
Abs Immature Granulocytes: 0.02 10*3/uL (ref 0.00–0.07)
Basophils Absolute: 0 10*3/uL (ref 0.0–0.1)
Basophils Relative: 1 %
Eosinophils Absolute: 0 10*3/uL (ref 0.0–0.5)
Eosinophils Relative: 1 %
HCT: 46.6 % (ref 39.0–52.0)
Hemoglobin: 16.5 g/dL (ref 13.0–17.0)
Immature Granulocytes: 0 %
Lymphocytes Relative: 25 %
Lymphs Abs: 1.3 10*3/uL (ref 0.7–4.0)
MCH: 29.7 pg (ref 26.0–34.0)
MCHC: 35.4 g/dL (ref 30.0–36.0)
MCV: 84 fL (ref 80.0–100.0)
Monocytes Absolute: 0.7 10*3/uL (ref 0.1–1.0)
Monocytes Relative: 14 %
Neutro Abs: 3.1 10*3/uL (ref 1.7–7.7)
Neutrophils Relative %: 59 %
Platelets: 287 10*3/uL (ref 150–400)
RBC: 5.55 MIL/uL (ref 4.22–5.81)
RDW: 12.2 % (ref 11.5–15.5)
WBC: 5.2 10*3/uL (ref 4.0–10.5)
nRBC: 0 % (ref 0.0–0.2)

## 2023-09-20 LAB — COMPREHENSIVE METABOLIC PANEL
ALT: 20 U/L (ref 0–44)
AST: 22 U/L (ref 15–41)
Albumin: 3.9 g/dL (ref 3.5–5.0)
Alkaline Phosphatase: 72 U/L (ref 38–126)
Anion gap: 13 (ref 5–15)
BUN: 8 mg/dL (ref 6–20)
CO2: 24 mmol/L (ref 22–32)
Calcium: 9.2 mg/dL (ref 8.9–10.3)
Chloride: 99 mmol/L (ref 98–111)
Creatinine, Ser: 1.02 mg/dL (ref 0.61–1.24)
GFR, Estimated: >60 mL/min (ref >60)
Glucose, Bld: 133 mg/dL — ABNORMAL HIGH (ref 70–99)
Potassium: 3.6 mmol/L (ref 3.5–5.1)
Sodium: 136 mmol/L (ref 135–145)
Total Bilirubin: 0.7 mg/dL (ref 0.0–1.2)
Total Protein: 7.2 g/dL (ref 6.5–8.1)

## 2023-09-20 LAB — RESP PANEL BY RT-PCR (RSV, FLU A&B, COVID)  RVPGX2
Influenza A by PCR: NEGATIVE
Influenza B by PCR: NEGATIVE
Resp Syncytial Virus by PCR: NEGATIVE
SARS Coronavirus 2 by RT PCR: NEGATIVE

## 2023-09-20 MED ORDER — ONDANSETRON 4 MG PO TBDP: ORAL_TABLET | ORAL | 0 refills | Status: AC

## 2023-09-20 MED ORDER — KETOROLAC TROMETHAMINE 15 MG/ML IJ SOLN
15.0000 mg | Freq: Once | INTRAMUSCULAR | Status: AC
  Administered 2023-09-20: 15 mg via INTRAVENOUS
  Filled 2023-09-20: qty 1

## 2023-09-20 MED ORDER — SODIUM CHLORIDE 0.9 % IV BOLUS
1000.0000 mL | Freq: Once | INTRAVENOUS | Status: AC
  Administered 2023-09-20: 1000 mL via INTRAVENOUS

## 2023-09-20 MED ORDER — ONDANSETRON HCL 4 MG/2ML IJ SOLN
4.0000 mg | Freq: Once | INTRAMUSCULAR | Status: AC
  Administered 2023-09-20: 4 mg via INTRAVENOUS
  Filled 2023-09-20: qty 2

## 2023-09-20 NOTE — ED Provider Triage Note (Signed)
 Emergency Medicine Provider Triage Evaluation Note  Hikaru Delorenzo , a 48 y.o. male  was evaluated in triage.  Pt complains of flu symptoms since Friday.  He has ongoing coughing and severe fatigue.  Has had some nausea and vomiting with diarrhea.  His wife is at home sick with similar symptoms.  He has been having intermittent fevers..  Review of Systems  Positive: Weakness, fevers, vomiting, diarrhea, cough Negative:    Physical Exam  BP 123/86 (BP Location: Right Arm)   Pulse 88   Temp 98.7 F (37.1 C)   Resp 16   SpO2 97%  Gen:   Awake, no distress    Resp:  Normal effort   MSK:   Moves extremities without difficulty   Other:     Medical Decision Making  Medically screening exam initiated at 10:59 AM.  Appropriate orders placed.  Scott Vanderveer was informed that the remainder of the evaluation will be completed by another provider, this initial triage assessment does not replace that evaluation, and the importance of remaining in the ED until their evaluation is complete.      Rolan Bucco, MD 09/20/23 1100

## 2023-09-20 NOTE — ED Provider Notes (Signed)
 [place] EMERGENCY DEPARTMENT AT [facility] Provider Note   CSN: [id number] Arrival date & time: 09/20/23  1044     History  Chief Complaint  Patient presents with   Fever   Nausea   Emesis   Diarrhea    Alexander Coleman is a 48 y.o. male.  Patient is a 48 year old male who presents with flulike symptoms.  He states has been going on about 4 to 5 days.  He presents with cough and congestion with some intermittent fevers.  He is also had some nausea vomiting and diarrhea.  He has had some myalgias and headaches.  He says his wife has the same symptoms.       Home Medications Prior to Admission medications   Medication Sig Start Date End Date Taking? Authorizing Provider  ondansetron (ZOFRAN-ODT) 4 MG disintegrating tablet 4mg  ODT q4 hours prn nausea/vomit 09/20/23  Yes Rolan Bucco, MD  albuterol (VENTOLIN HFA) 108 (90 Base) MCG/ACT inhaler Inhale 2 puffs into the lungs every 4 (four) hours as needed for wheezing or shortness of breath. 03/25/22   Jannifer Franklin, MD  aspirin EC 325 MG tablet Take 1 tablet (325 mg total) by mouth daily. 03/25/22   Piontek, Denny Peon, MD  HYDROcodone-acetaminophen (NORCO/VICODIN) 5-325 MG tablet Take 1 tablet by mouth every 4 (four) hours as needed. 02/20/18   Sabas Sous, MD  ibuprofen (ADVIL,MOTRIN) 800 MG tablet Take 800 mg by mouth every 8 (eight) hours as needed for mild pain.    [provider]  loratadine (CLARITIN) 10 MG tablet Take 10 mg by mouth daily.    [provider]  nicotine (NICODERM CQ) 7 mg/24hr patch Place 1 patch (7 mg total) onto the skin daily. 01/22/22   Franne Forts, DO      Allergies    Patient has no known allergies.    Review of Systems   Review of Systems  Constitutional:  Positive for fatigue and fever. Negative for chills and diaphoresis.  HENT:  Positive for congestion and rhinorrhea. Negative for sneezing.   Eyes: Negative.   Respiratory:  Positive for cough. Negative for chest tightness  and shortness of breath.   Cardiovascular:  Negative for chest pain and leg swelling.  Gastrointestinal:  Positive for diarrhea, nausea and vomiting. Negative for abdominal pain and blood in stool.  Genitourinary:  Negative for difficulty urinating, flank pain, frequency and hematuria.  Musculoskeletal:  Negative for arthralgias and back pain.  Skin:  Negative for rash.  Neurological:  Negative for dizziness, speech difficulty, weakness, numbness and headaches.    Physical Exam Updated Vital Signs BP 123/86 (BP Location: Right Arm)   Pulse 88   Temp 98.7 F (37.1 C)   Resp 16   Ht 6\' 1"  (1.854 m)   Wt 91.6 kg   SpO2 97%   BMI 26.65 kg/m  Physical Exam Constitutional:      Appearance: He is well-developed.  HENT:     Head: Normocephalic and atraumatic.  Eyes:     Pupils: Pupils are equal, round, and reactive to light.  Cardiovascular:     Rate and Rhythm: Normal rate and regular rhythm.     Heart sounds: Normal heart sounds.  Pulmonary:     Effort: Pulmonary effort is normal. No respiratory distress.     Breath sounds: Normal breath sounds. No wheezing or rales.  Chest:     Chest wall: No tenderness.  Abdominal:     General: Bowel sounds are normal.  Palpations: Abdomen is soft.     Tenderness: There is no abdominal tenderness. There is no guarding or rebound.  Musculoskeletal:        General: Normal range of motion.     Cervical back: Normal range of motion and neck supple.  Lymphadenopathy:     Cervical: No cervical adenopathy.  Skin:    General: Skin is warm and dry.     Findings: No rash.  Neurological:     Mental Status: He is alert and oriented to person, place, and time.     ED Results / Procedures / Treatments   Labs (all labs ordered are listed, but only abnormal results are displayed) Labs Reviewed  COMPREHENSIVE METABOLIC PANEL - Abnormal; Notable for the following components:      Result Value   Glucose, Bld 133 (*)    All other components  within normal limits  RESP PANEL BY RT-PCR (RSV, FLU A&B, COVID)  [id number]  CBC WITH DIFFERENTIAL/PLATELET    EKG None  Radiology DG Chest 2 View Result Date: 09/20/2023 CLINICAL DATA:  Cough and fever.  Flu. EXAM: CHEST - 2 VIEW COMPARISON:  Chest radiograph 03/15/2016 FINDINGS: The heart is normal in size. There is mild bilateral hilar prominence. Borderline hyperinflation with mild bronchial thickening. No focal airspace disease. No pleural fluid or pneumothorax. No acute osseous findings. IMPRESSION: 1. Bronchial thickening without focal airspace disease. 2. Slight hilar prominence is nonspecific, may be due to prominent central pulmonary arteries. This is stable from 2017. Electronically Signed   By: Narda Rutherford M.D.   On: 09/20/2023 13:14    Procedures Procedures    Medications Ordered in ED Medications  sodium chloride 0.9 % bolus 1,000 mL (0 mLs Intravenous Stopped 09/20/23 1347)  ondansetron (ZOFRAN) injection 4 mg (4 mg Intravenous Given 09/20/23 1254)  ketorolac (TORADOL) 15 MG/ML injection 15 mg (15 mg Intravenous Given 09/20/23 1254)    ED Course/ Medical Decision Making/ A&P                                 Medical Decision Making Amount and/or Complexity of Data Reviewed Labs: ordered. Radiology: ordered.  Risk Prescription drug management.   Patient is a 48 year old male who presents with flulike symptoms.  He has had some cough congestion and myalgias associated with nausea and vomiting.  He is overall well-appearing.  His vital signs are stable.  Is afebrile here in the ED although he reports fevers at home.  Labs reviewed and are nonconcerning.  Chest x-ray two-view was interpreted by me confirmed by the radiologist to show no acute abnormalities.  No evidence of pneumonia.  He was given IV fluids and a dose of Toradol and is feeling somewhat better after this although he still feels a bit lightheaded.  He does not have any vertiginous symptoms.  No concerns for  stroke.  No symptoms that we more concerning for meningitis.  I suspect he has a viral syndrome.  He was discharged home in good condition.  Symptomatic care instructions were given.  Return precautions were given.  He was given a prescription for Zofran to use at home for the nausea.  Final Clinical Impression(s) / ED Diagnoses Final diagnoses:  Viral syndrome    Rx / DC Orders ED Discharge Orders          Ordered    ondansetron (ZOFRAN-ODT) 4 MG disintegrating tablet  09/20/23 1358              Rolan Bucco, MD 09/20/23 1414

## 2023-09-20 NOTE — ED Triage Notes (Addendum)
 Pt. Stated, Alexander Coleman had the flu for about a week with fever, N/V/D , fever and weak all over. My stool is very black. I have been on an antibiotic , I think Amoxicillin for a tooth extraction.
